# Patient Record
Sex: Male | Born: 1975
Health system: Southern US, Community
[De-identification: ages and names within clinical notes are randomized; demographics above are authoritative.]

## PROBLEM LIST (undated history)

## (undated) DIAGNOSIS — E785 Hyperlipidemia, unspecified: Secondary | ICD-10-CM

## (undated) DIAGNOSIS — F32A Depression, unspecified: Secondary | ICD-10-CM

## (undated) DIAGNOSIS — I10 Essential (primary) hypertension: Secondary | ICD-10-CM

## (undated) DIAGNOSIS — K219 Gastro-esophageal reflux disease without esophagitis: Secondary | ICD-10-CM

## (undated) DIAGNOSIS — F1010 Alcohol abuse, uncomplicated: Secondary | ICD-10-CM

## (undated) DIAGNOSIS — K859 Acute pancreatitis without necrosis or infection, unspecified: Secondary | ICD-10-CM

## (undated) DIAGNOSIS — N4340 Spermatocele of epididymis, unspecified: Secondary | ICD-10-CM

## (undated) DIAGNOSIS — D72829 Elevated white blood cell count, unspecified: Secondary | ICD-10-CM

## (undated) DIAGNOSIS — D649 Anemia, unspecified: Secondary | ICD-10-CM

## (undated) DIAGNOSIS — K760 Fatty (change of) liver, not elsewhere classified: Secondary | ICD-10-CM

## (undated) HISTORY — PX: COLONOSCOPY: SHX174

## (undated) HISTORY — PX: UPPER GI ENDOSCOPY: SHX6162

---

## 2017-12-28 ENCOUNTER — Inpatient Hospital Stay
Admission: EM | Admit: 2017-12-28 | Discharge: 2017-12-30 | DRG: 439 | Disposition: A | Discharge status: Home / Self Care | Payer: 59 | Attending: Internal Medicine | Admitting: Internal Medicine

## 2017-12-28 ENCOUNTER — Encounter: Payer: Self-pay | Admitting: Emergency Medicine

## 2017-12-28 ENCOUNTER — Other Ambulatory Visit: Payer: Self-pay

## 2017-12-28 DIAGNOSIS — R1010 Upper abdominal pain, unspecified: Secondary | ICD-10-CM | POA: Diagnosis not present

## 2017-12-28 DIAGNOSIS — K859 Acute pancreatitis without necrosis or infection, unspecified: Secondary | ICD-10-CM | POA: Diagnosis present

## 2017-12-28 DIAGNOSIS — R112 Nausea with vomiting, unspecified: Secondary | ICD-10-CM | POA: Diagnosis not present

## 2017-12-28 DIAGNOSIS — N179 Acute kidney failure, unspecified: Secondary | ICD-10-CM | POA: Diagnosis not present

## 2017-12-28 DIAGNOSIS — Z79899 Other long term (current) drug therapy: Secondary | ICD-10-CM

## 2017-12-28 DIAGNOSIS — Z882 Allergy status to sulfonamides status: Secondary | ICD-10-CM | POA: Diagnosis not present

## 2017-12-28 DIAGNOSIS — F419 Anxiety disorder, unspecified: Secondary | ICD-10-CM | POA: Diagnosis present

## 2017-12-28 DIAGNOSIS — K852 Alcohol induced acute pancreatitis without necrosis or infection: Secondary | ICD-10-CM | POA: Diagnosis not present

## 2017-12-28 DIAGNOSIS — F1010 Alcohol abuse, uncomplicated: Secondary | ICD-10-CM | POA: Diagnosis present

## 2017-12-28 DIAGNOSIS — R109 Unspecified abdominal pain: Secondary | ICD-10-CM | POA: Diagnosis not present

## 2017-12-28 HISTORY — DX: Acute pancreatitis without necrosis or infection, unspecified: K85.90

## 2017-12-28 LAB — COMPREHENSIVE METABOLIC PANEL
ALK PHOS: 36 U/L — AB (ref 38–126)
ALT: 20 U/L (ref 17–63)
AST: 32 U/L (ref 15–41)
Albumin: 4.7 g/dL (ref 3.5–5.0)
Anion gap: 11 (ref 5–15)
BUN: 25 mg/dL — ABNORMAL HIGH (ref 6–20)
CALCIUM: 9.6 mg/dL (ref 8.9–10.3)
CHLORIDE: 104 mmol/L (ref 101–111)
CO2: 23 mmol/L (ref 22–32)
CREATININE: 1.26 mg/dL — AB (ref 0.61–1.24)
Glucose, Bld: 184 mg/dL — ABNORMAL HIGH (ref 65–99)
Potassium: 3.7 mmol/L (ref 3.5–5.1)
SODIUM: 138 mmol/L (ref 135–145)
Total Bilirubin: 0.9 mg/dL (ref 0.3–1.2)
Total Protein: 7.4 g/dL (ref 6.5–8.1)

## 2017-12-28 LAB — LACTIC ACID, PLASMA
LACTIC ACID, VENOUS: 3.3 mmol/L — AB (ref 0.5–1.9)
Lactic Acid, Venous: 3.8 mmol/L (ref 0.5–1.9)

## 2017-12-28 LAB — LIPASE, BLOOD: LIPASE: 966 U/L — AB (ref 11–51)

## 2017-12-28 LAB — TROPONIN I

## 2017-12-28 LAB — LACTATE DEHYDROGENASE: LDH: 142 U/L (ref 98–192)

## 2017-12-28 LAB — CBC
HCT: 47 % (ref 40.0–52.0)
HEMOGLOBIN: 15.8 g/dL (ref 13.0–18.0)
MCH: 31 pg (ref 26.0–34.0)
MCHC: 33.6 g/dL (ref 32.0–36.0)
MCV: 92.4 fL (ref 80.0–100.0)
PLATELETS: 343 10*3/uL (ref 150–440)
RBC: 5.09 MIL/uL (ref 4.40–5.90)
RDW: 13.2 % (ref 11.5–14.5)
WBC: 15.2 10*3/uL — AB (ref 3.8–10.6)

## 2017-12-28 MED ORDER — SODIUM CHLORIDE 0.9 % IV BOLUS
1000.0000 mL | Freq: Once | INTRAVENOUS | Status: AC
  Administered 2017-12-28: 1000 mL via INTRAVENOUS

## 2017-12-28 MED ORDER — SODIUM CHLORIDE 0.9 % IV SOLN
INTRAVENOUS | Status: DC
  Administered 2017-12-28 – 2017-12-30 (×3): via INTRAVENOUS

## 2017-12-28 MED ORDER — HYDROMORPHONE HCL 1 MG/ML IJ SOLN
1.0000 mg | Freq: Once | INTRAMUSCULAR | Status: AC
  Administered 2017-12-28: 1 mg via INTRAVENOUS
  Filled 2017-12-28: qty 1

## 2017-12-28 MED ORDER — ONDANSETRON HCL 4 MG/2ML IJ SOLN
4.0000 mg | Freq: Four times a day (QID) | INTRAMUSCULAR | Status: DC | PRN
  Administered 2017-12-28: 4 mg via INTRAVENOUS
  Filled 2017-12-28: qty 2

## 2017-12-28 MED ORDER — ENOXAPARIN SODIUM 40 MG/0.4ML ~~LOC~~ SOLN
40.0000 mg | SUBCUTANEOUS | Status: DC
  Administered 2017-12-28 – 2017-12-29 (×2): 40 mg via SUBCUTANEOUS
  Filled 2017-12-28 (×2): qty 0.4

## 2017-12-28 MED ORDER — HYDROMORPHONE HCL 1 MG/ML IJ SOLN
1.0000 mg | INTRAMUSCULAR | Status: DC | PRN
Start: 2017-12-28 — End: 2017-12-30
  Administered 2017-12-28 – 2017-12-29 (×4): 1 mg via INTRAVENOUS
  Filled 2017-12-28 (×5): qty 1

## 2017-12-28 MED ORDER — ADULT MULTIVITAMIN W/MINERALS CH
1.0000 | ORAL_TABLET | Freq: Every day | ORAL | Status: DC
  Administered 2017-12-28 – 2017-12-30 (×3): 1 via ORAL
  Filled 2017-12-28 (×3): qty 1

## 2017-12-28 MED ORDER — PANTOPRAZOLE SODIUM 40 MG PO TBEC
40.0000 mg | DELAYED_RELEASE_TABLET | Freq: Every day | ORAL | Status: DC
  Administered 2017-12-28 – 2017-12-30 (×3): 40 mg via ORAL
  Filled 2017-12-28 (×3): qty 1

## 2017-12-28 MED ORDER — LORAZEPAM 2 MG PO TABS: 0.0000 mg | ORAL_TABLET | Freq: Two times a day (BID) | ORAL | Status: DC

## 2017-12-28 MED ORDER — LORAZEPAM 2 MG/ML IJ SOLN: 1.0000 mg | Freq: Four times a day (QID) | INTRAMUSCULAR | Status: DC | PRN

## 2017-12-28 MED ORDER — ONDANSETRON HCL 4 MG/2ML IJ SOLN
4.0000 mg | Freq: Once | INTRAMUSCULAR | Status: AC
  Administered 2017-12-28: 4 mg via INTRAVENOUS
  Filled 2017-12-28: qty 2

## 2017-12-28 MED ORDER — OXYCODONE HCL 5 MG PO TABS
5.0000 mg | ORAL_TABLET | ORAL | Status: DC | PRN
  Administered 2017-12-28 – 2017-12-29 (×3): 5 mg via ORAL
  Filled 2017-12-28 (×3): qty 1

## 2017-12-28 MED ORDER — THIAMINE HCL 100 MG/ML IJ SOLN
Freq: Once | INTRAVENOUS | Status: AC
  Administered 2017-12-28: 16:00:00 via INTRAVENOUS
  Filled 2017-12-28: qty 1000

## 2017-12-28 MED ORDER — THIAMINE HCL 100 MG/ML IJ SOLN
100.0000 mg | Freq: Every day | INTRAMUSCULAR | Status: DC
  Filled 2017-12-28: qty 1

## 2017-12-28 MED ORDER — LORAZEPAM 2 MG PO TABS: 0.0000 mg | ORAL_TABLET | Freq: Four times a day (QID) | ORAL | Status: DC

## 2017-12-28 MED ORDER — FOLIC ACID 1 MG PO TABS
1.0000 mg | ORAL_TABLET | Freq: Every day | ORAL | Status: DC
  Administered 2017-12-28 – 2017-12-30 (×3): 1 mg via ORAL
  Filled 2017-12-28 (×3): qty 1

## 2017-12-28 MED ORDER — LORAZEPAM 1 MG PO TABS: 1.0000 mg | ORAL_TABLET | Freq: Four times a day (QID) | ORAL | Status: DC | PRN

## 2017-12-28 MED ORDER — VITAMIN B-1 100 MG PO TABS
100.0000 mg | ORAL_TABLET | Freq: Every day | ORAL | Status: DC
  Administered 2017-12-28 – 2017-12-30 (×3): 100 mg via ORAL
  Filled 2017-12-28 (×3): qty 1

## 2017-12-28 MED ORDER — TRAMADOL HCL 50 MG PO TABS: 50.0000 mg | ORAL_TABLET | Freq: Four times a day (QID) | ORAL | Status: DC | PRN

## 2017-12-28 NOTE — ED Triage Notes (Signed)
Upper abd pain for 3 hours constant, but gets worse at times.  Skin cool and pale.  Says history of pancreatitis and this feels like that.

## 2017-12-28 NOTE — Progress Notes (Signed)
CRITICAL VALUE ALERT  Critical Value:  3.3 lactic   Date & Time Notied:  12/28/17 1439  Provider Notified: Gouru  Orders Received/Actions taken: No new orders

## 2017-12-28 NOTE — H&P (Addendum)
[facility] - [place] at [facility]   PATIENT NAME: Christopher Savage    MR#:  [id number]  DATE OF BIRTH:  Aug 07, 1976  DATE OF ADMISSION:  12/28/2017  PRIMARY CARE PHYSICIAN: Patient, No Pcp Per   REQUESTING/REFERRING PHYSICIAN: Dionne Bucy, MD  CHIEF COMPLAINT:  Acute abd pain  HISTORY OF PRESENT ILLNESS:  Christopher Savage  is a 42 y.o. male with a known history of alcohol abuse and history of pancreatitis from the alcohol in the past, recently moved from [place] and had extensive workup for pancreatitis and diagnosed with alcoholic pancreatitis in the past is presenting with acute abdominal pain in the epigastric and left upper quadrant area started from yesterday associated with nausea and vomiting.  Lipase is elevated in the ED.  His mother is at bedside Foley was in [place].  Reporting  PAST MEDICAL HISTORY:   Past Medical History:  Diagnosis Date  . Pancreatitis     PAST SURGICAL HISTOIRY:  History reviewed. No pertinent surgical history.  SOCIAL HISTORY:   Social History   Tobacco Use  . Smoking status: Never Smoker  . Smokeless tobacco: Never Used  Substance Use Topics  . Alcohol use: Yes    FAMILY HISTORY:  No family history on file.  DRUG ALLERGIES:   Allergies  Allergen Reactions  . Sulfa Antibiotics Hives    REVIEW OF SYSTEMS:  CONSTITUTIONAL: No fever, fatigue or weakness.  EYES: No blurred or double vision.  EARS, NOSE, AND THROAT: No tinnitus or ear pain.  RESPIRATORY: No cough, shortness of breath, wheezing or hemoptysis.  CARDIOVASCULAR: No chest pain, orthopnea, edema.  GASTROINTESTINAL: Reporting nausea, vomiting,  and epigastric abdominal pain.  GENITOURINARY: No dysuria, hematuria.  ENDOCRINE: No polyuria, nocturia,  HEMATOLOGY: No anemia, easy bruising or bleeding SKIN: No rash or lesion. MUSCULOSKELETAL: No joint pain or arthritis.   NEUROLOGIC: No tingling, numbness, weakness.  PSYCHIATRY: No  anxiety or depression.   MEDICATIONS AT HOME:   Prior to Admission medications   Medication Sig Start Date End Date Taking? Authorizing Provider  escitalopram (LEXAPRO) 10 MG tablet Take 10 mg by mouth daily.   Yes [provider]      VITAL SIGNS:  Blood pressure 135/83, pulse 68, temperature 97.7 F (36.5 C), temperature source Oral, resp. rate (!) 22, height 6\' 4"  (1.93 m), weight 103.7 kg (228 lb 9.9 oz), SpO2 100 %.  PHYSICAL EXAMINATION:  GENERAL:  42 y.o.-year-old patient lying in the bed with no acute distress.  EYES: Pupils equal, round, reactive to light and accommodation. No scleral icterus. Extraocular muscles intact.  HEENT: Head atraumatic, normocephalic. Oropharynx and nasopharynx clear.  NECK:  Supple, no jugular venous distention. No thyroid enlargement, no tenderness.  LUNGS: Normal breath sounds bilaterally, no wheezing, rales,rhonchi or crepitation. No use of accessory muscles of respiration.  CARDIOVASCULAR: S1, S2 normal. No murmurs, rubs, or gallops.  ABDOMEN: Soft,  has epigastric tenderness, nondistended. Bowel sounds present.EXTREMITIES: No pedal edema, cyanosis, or clubbing.  NEUROLOGIC: Cranial nerves II through XII are intact. Muscle strength 5/5 in all extremities. Sensation intact. Gait not checked.  PSYCHIATRIC: The patient is alert and oriented x 3.  SKIN: No obvious rash, lesion, or ulcer.   LABORATORY PANEL:   CBC Recent Labs  Lab 12/28/17 1130  WBC 15.2*  HGB 15.8  HCT 47.0  PLT 343   ------------------------------------------------------------------------------------------------------------------  Chemistries  Recent Labs  Lab 12/28/17 1130  NA 138  K 3.7  CL 104  CO2 23  GLUCOSE  184*  BUN 25*  CREATININE 1.26*  CALCIUM 9.6  AST 32  ALT 20  ALKPHOS 36*  BILITOT 0.9   ------------------------------------------------------------------------------------------------------------------  Cardiac Enzymes Recent Labs   Lab 12/28/17 1130  TROPONINI <0.03   ------------------------------------------------------------------------------------------------------------------  RADIOLOGY:  No results found.  EKG:   Orders placed or performed during the hospital encounter of 12/28/17  . ED EKG  . ED EKG  . EKG 12-Lead  . EKG 12-Lead  . EKG 12-Lead  . EKG 12-Lead  . EKG 12-Lead  . EKG 12-Lead  . EKG 12-Lead  . EKG 12-Lead    IMPRESSION AND PLAN:   #Acute epigastric abdominal pain from pancreatitis Admit to MedSurg unit N.p.o., IV fluids, pain management as needed Supportive treatment  #Acute pancreatitis alcoholic N.p.o., IV fluids and pain management Monitor lipase level in a.m. Counseled patient to stop drinking alcohol PPI  # AKI IVF , avoid nephrotoxins  #Alcohol abuse disorder Counseled patient to stop drinking alcohol and outpatient follow-up with alcohol anonymous CIWA Fluids with multivitamin, thiamine and folate  #Anxiety Benzos as needed    All the records are reviewed and case discussed with ED provider. Management plans discussed with the patient, family and they are in agreement.  CODE STATUS: fc   TOTAL TIME TAKING CARE OF THIS PATIENT: 43  minutes.   Note: This dictation was prepared with Dragon dictation along with smaller phrase technology. Any transcriptional errors that result from this process are unintentional.  Ramonita LabAruna Haiven Nardone M.D on 12/28/2017 at 2:31 PM  Between 7am to 6pm - Pager - [phone]  After 6pm go to [email or web address] - password [email or web address]  [facility]io [facility]  Office  [phone]  CC: Primary care physician; Patient, No Pcp Per

## 2017-12-28 NOTE — Progress Notes (Signed)
Anticoagulation monitoring(Lovenox):  42 yo male ordered Lovenox 30 mg Q24h  Filed Weights   12/28/17 1126 12/28/17 1411  Weight: 220 lb (99.8 kg) 228 lb 9.9 oz (103.7 kg)   BMI    Lab Results  Component Value Date   CREATININE 1.26 (H) 12/28/2017   Estimated Creatinine Clearance: 92.8 mL/min (A) (by C-G formula based on SCr of 1.26 mg/dL (H)). Hemoglobin & Hematocrit     Component Value Date/Time   HGB 15.8 12/28/2017 1130   HCT 47.0 12/28/2017 1130     Per Protocol for Patient with estCrcl > 30 ml/min and BMI < 40, will transition to Lovenox 40 mg Q24h.

## 2017-12-28 NOTE — ED Notes (Signed)
Date and time results received: 12/28/17 1226 (use smartphrase ".now" to insert current time)  Test: lactic Critical Value: 3.8  Name of Provider Notified: Siadecki  Orders Received? Or Actions Taken?: Orders Received - See Orders for details

## 2017-12-28 NOTE — ED Provider Notes (Signed)
[facility] Emergency Department Provider Note ____________________________________________   First MD Initiated Contact with Patient 12/28/17 1132     (approximate)  I have reviewed the triage vital signs and the nursing notes.   HISTORY  Chief Complaint Abdominal Pain    HPI Christopher Savage is a 42 y.o. male with history of pancreatitis (patient states alcohol related) who presents with upper abdominal pain for the last 3 hours, acute onset, constant but waxing and waning in intensity, nonradiating, and associated with nausea and vomiting.  No diarrhea or fever.  Patient states it feels similar to prior pancreatitis.  He states he has had pancreatitis twice before.  He reports that yesterday he drank 6 beers, and had spicy food during the weekend which she normally does not.  He denies daily alcohol use.   Past Medical History:  Diagnosis Date  . Pancreatitis     There are no active problems to display for this patient.   History reviewed. No pertinent surgical history.  Prior to Admission medications   Not on File    Allergies Sulfa antibiotics  No family history on file.  Social History Social History   Tobacco Use  . Smoking status: Never Smoker  . Smokeless tobacco: Never Used  Substance Use Topics  . Alcohol use: Yes  . Drug use: Not on file    Review of Systems  Constitutional: No fever.  Positive for chills. Eyes: No redness. ENT: No sore throat. Cardiovascular: Denies chest pain. Respiratory: Denies shortness of breath. Gastrointestinal: No positive for abdominal pain and vomiting.  Genitourinary: Negative for dysuria.  Musculoskeletal: Negative for back pain. Skin: Negative for rash. Neurological: Negative for headache.   ____________________________________________   PHYSICAL EXAM:  VITAL SIGNS: ED Triage Vitals [12/28/17 1126]  Enc Vitals Group     BP 111/72     Pulse Rate (!) 57     Resp 16     Temp     Temp src      SpO2 98 %     Weight 220 lb (99.8 kg)     Height 6\' 4"  (1.93 m)     Head Circumference      Peak Flow      Pain Score 10     Pain Loc      Pain Edu?      Excl. in GC?     Constitutional: Alert and oriented.  Uncomfortable but in no acute distress. Eyes: Conjunctivae are normal.  No scleral icterus. Head: Atraumatic. Nose: No congestion/rhinnorhea. Mouth/Throat: Mucous membranes are slightly dry.   Neck: Normal range of motion.  Cardiovascular: Normal rate, regular rhythm. Grossly normal heart sounds.  Good peripheral circulation. Respiratory: Normal respiratory effort.  No retractions. Lungs CTAB. Gastrointestinal: Soft with moderate epigastric tenderness. No distention.  Genitourinary: No flank tenderness. Musculoskeletal:   Extremities warm and well perfused.  Neurologic:  Normal speech and language. No gross focal neurologic deficits are appreciated.  Skin:  Skin is warm and dry. No rash noted. Psychiatric: Mood and affect are normal. Speech and behavior are normal.  ____________________________________________   LABS (all labs ordered are listed, but only abnormal results are displayed)  Labs Reviewed  LIPASE, BLOOD - Abnormal; Notable for the following components:      Result Value   Lipase 966 (*)    All other components within normal limits  COMPREHENSIVE METABOLIC PANEL - Abnormal; Notable for the following components:   Glucose, Bld 184 (*)    BUN  25 (*)    Creatinine, Ser 1.26 (*)    Alkaline Phosphatase 36 (*)    All other components within normal limits  CBC - Abnormal; Notable for the following components:   WBC 15.2 (*)    All other components within normal limits  LACTIC ACID, PLASMA - Abnormal; Notable for the following components:   Lactic Acid, Venous 3.8 (*)    All other components within normal limits  LACTATE DEHYDROGENASE  TROPONIN I  LACTIC ACID, PLASMA   ____________________________________________  EKG  ED ECG REPORT I,  Dionne Bucy, the attending physician, personally viewed and interpreted this ECG.  Date: 12/28/2017 EKG Time: 1124 Rate: 55 Rhythm: normal sinus rhythm QRS Axis: normal Intervals: normal ST/T Wave abnormalities: <75mm ST elevation inferior consistent with early repolarization Narrative Interpretation: no evidence of acute ischemia; no prior EKG available for comparison  ____________________________________________  RADIOLOGY    ____________________________________________   PROCEDURES  Procedure(s) performed: No  Procedures  Critical Care performed: No ____________________________________________   INITIAL IMPRESSION / ASSESSMENT AND PLAN / ED COURSE  Pertinent labs & imaging results that were available during my care of the patient were reviewed by me and considered in my medical decision making (see chart for details).  42 year old male with past history of pancreatitis induced by alcohol presents with epigastric pain, nausea, and vomiting which she states is identical to prior pancreatitis and occurred after he drank relatively heavily during the weekend.  I attempted to review past medical records in epic and the [place] PMP Registry, however patient states he recently moved from [place] and has no prior records here.  On exam, the patient is uncomfortable but not acutely ill-appearing, he has epigastric tenderness, and the remainder the exam is as described above.  Presentation is concerning for recurrent pancreatitis versus gastritis or PUD.  Less likely other hepatobiliary cause.  Will obtain labs, give IV fluids, and reassess.  If no significant lab abnormalities, consider imaging.  Of note, the patient repeatedly requested pain medication from RNs prior to my evaluation, and then specifically asked me for Dilaudid.  At this time patient's evaluation is consistent with pancreatitis so we will treat with opiate analgesics as normally indicated.  I have no prior  records to determine if patient has any prior substance abuse history, but he denies this.  We will reassess if workup is negative.    ----------------------------------------- 12:44 PM on 12/28/2017 -----------------------------------------  Lactate is elevated.  This is consistent with likely acute pancreatitis and vomiting.  Patient's vital signs are stable and there is no clinical evidence of sepsis.  ----------------------------------------- 1:03 PM on 12/28/2017 -----------------------------------------  Labs reveal significantly elevated lipase, but normal LFTs and otherwise reassuring.  Due to the elevated lipase and lactic acid, patient is appropriate for admission for IV hydration and pain control.  I will sign the patient out to the hospitalist.  ____________________________________________   FINAL CLINICAL IMPRESSION(S) / ED DIAGNOSES  Final diagnoses:  Alcohol-induced acute pancreatitis, unspecified complication status      NEW MEDICATIONS STARTED DURING THIS VISIT:  New Prescriptions   No medications on file     Note:  This document was prepared using Dragon voice recognition software and may include unintentional dictation errors.    Dionne Bucy, MD 12/28/17 1304

## 2017-12-29 LAB — LIPASE, BLOOD: Lipase: 1070 U/L — ABNORMAL HIGH (ref 11–51)

## 2017-12-29 LAB — COMPREHENSIVE METABOLIC PANEL
ALBUMIN: 4 g/dL (ref 3.5–5.0)
ALK PHOS: 29 U/L — AB (ref 38–126)
ALT: 17 U/L (ref 17–63)
AST: 24 U/L (ref 15–41)
Anion gap: 8 (ref 5–15)
BILIRUBIN TOTAL: 1 mg/dL (ref 0.3–1.2)
BUN: 18 mg/dL (ref 6–20)
CALCIUM: 8.8 mg/dL — AB (ref 8.9–10.3)
CO2: 26 mmol/L (ref 22–32)
CREATININE: 1.15 mg/dL (ref 0.61–1.24)
Chloride: 104 mmol/L (ref 101–111)
GFR calc Af Amer: >60 mL/min (ref >60)
GLUCOSE: 131 mg/dL — AB (ref 65–99)
POTASSIUM: 4 mmol/L (ref 3.5–5.1)
Sodium: 138 mmol/L (ref 135–145)
TOTAL PROTEIN: 6.8 g/dL (ref 6.5–8.1)

## 2017-12-29 LAB — CBC
HEMATOCRIT: 45.2 % (ref 40.0–52.0)
Hemoglobin: 15.2 g/dL (ref 13.0–18.0)
MCH: 31.2 pg (ref 26.0–34.0)
MCHC: 33.6 g/dL (ref 32.0–36.0)
MCV: 92.8 fL (ref 80.0–100.0)
Platelets: 258 10*3/uL (ref 150–440)
RBC: 4.87 MIL/uL (ref 4.40–5.90)
RDW: 13.5 % (ref 11.5–14.5)
WBC: 9.7 10*3/uL (ref 3.8–10.6)

## 2017-12-29 MED ORDER — ESCITALOPRAM OXALATE 10 MG PO TABS
10.0000 mg | ORAL_TABLET | Freq: Every day | ORAL | Status: DC
  Administered 2017-12-29 – 2017-12-30 (×2): 10 mg via ORAL
  Filled 2017-12-29 (×2): qty 1

## 2017-12-29 MED ORDER — LACTULOSE 10 GM/15ML PO SOLN
30.0000 g | Freq: Every day | ORAL | Status: DC | PRN
  Administered 2017-12-29: 30 g via ORAL
  Filled 2017-12-29: qty 60

## 2017-12-29 NOTE — Progress Notes (Signed)
[facility] - [place] at [facility]   PATIENT NAME: Christopher Savage    MR#:  [id number]  DATE OF BIRTH:  July 01, 1976  SUBJECTIVE:   Patient came in with abdominal pain epigastric area. Feels somewhat better today.  Admits to drinking alcohol on a daily basis. No tremors agitation or tachycardia today REVIEW OF SYSTEMS:   Review of Systems  Constitutional: Negative for chills, fever and weight loss.  HENT: Negative for ear discharge, ear pain and nosebleeds.   Eyes: Negative for blurred vision, pain and discharge.  Respiratory: Negative for sputum production, shortness of breath, wheezing and stridor.   Cardiovascular: Negative for chest pain, palpitations, orthopnea and PND.  Gastrointestinal: Positive for abdominal pain. Negative for diarrhea, nausea and vomiting.  Genitourinary: Negative for frequency and urgency.  Musculoskeletal: Negative for back pain and joint pain.  Neurological: Negative for sensory change, speech change, focal weakness and weakness.  Psychiatric/Behavioral: Negative for depression and hallucinations. The patient is not nervous/anxious.    Tolerating Diet:yes Tolerating PT: not needed  DRUG ALLERGIES:   Allergies  Allergen Reactions  . Sulfa Antibiotics Hives    VITALS:  Blood pressure (!) 135/94, pulse 89, temperature 99 F (37.2 C), temperature source Oral, resp. rate 18, height 6\' 4"  (1.93 m), weight 103.7 kg (228 lb 9.9 oz), SpO2 98 %.  PHYSICAL EXAMINATION:   Physical Exam  GENERAL:  42 y.o.-year-old patient lying in the bed with no acute distress.  EYES: Pupils equal, round, reactive to light and accommodation. No scleral icterus. Extraocular muscles intact.  HEENT: Head atraumatic, normocephalic. Oropharynx and nasopharynx clear.  NECK:  Supple, no jugular venous distention. No thyroid enlargement, no tenderness.  LUNGS: Normal breath sounds bilaterally, no wheezing, rales, rhonchi. No use of accessory muscles of  respiration.  CARDIOVASCULAR: S1, S2 normal. No murmurs, rubs, or gallops.  ABDOMEN: Soft, nontender, nondistended. Bowel sounds present. No organomegaly or mass.  EXTREMITIES: No cyanosis, clubbing or edema b/l.    NEUROLOGIC: Cranial nerves II through XII are intact. No focal Motor or sensory deficits b/l.   PSYCHIATRIC:  patient is alert and oriented x 3.  SKIN: No obvious rash, lesion, or ulcer.   LABORATORY PANEL:  CBC Recent Labs  Lab 12/29/17 0705  WBC 9.7  HGB 15.2  HCT 45.2  PLT 258    Chemistries  Recent Labs  Lab 12/29/17 0705  NA 138  K 4.0  CL 104  CO2 26  GLUCOSE 131*  BUN 18  CREATININE 1.15  CALCIUM 8.8*  AST 24  ALT 17  ALKPHOS 29*  BILITOT 1.0   Cardiac Enzymes Recent Labs  Lab 12/28/17 1130  TROPONINI <0.03   RADIOLOGY:  No results found. ASSESSMENT AND PLAN:  Christopher Savage  is a 42 y.o. male with a known history of alcohol abuse and history of pancreatitis from the alcohol in the past, recently moved from [place] and had extensive workup for pancreatitis and diagnosed with alcoholic pancreatitis in the past is presenting with acute abdominal pain in the epigastric and left upper quadrant area started from yesterday associated with nausea and vomiting.  # Acute epigastric abdominal pain from alcohol induced pancreatitis N.p.o., IV fluids, pain management as needed Supportive treatment  -Lipase 966--- 1022--appears in a -CIWA  protocol.  Patient admits he will stop drinking alcohol  # AKI IVF  - avoid nephrotoxins  #Alcohol abuse disorder Counseled patient to stop drinking alcohol and outpatient follow-up with alcohol anonymous CIWA Fluids with multivitamin, thiamine and  folate  #Anxiety Benzos as needed -Patient is on low-dose Lexapro.  He follows up with psychiatrist as outpatient  Case discussed with Care Management/Social Worker. Management plans discussed with the patient, family and they are in agreement.  CODE  STATUS: FULL  DVT Prophylaxis: lovneox  TOTAL TIME TAKING CARE OF THIS PATIENT: *30minutes.  >50% time spent on counselling and coordination of care  POSSIBLE D/C IN 1-2 DAYS, DEPENDING ON CLINICAL CONDITION.  Note: This dictation was prepared with Dragon dictation along with smaller phrase technology. Any transcriptional errors that result from this process are unintentional.  Enedina FinnerSona Jamill Wetmore M.D on 12/29/2017 at 1:23 PM  Between 7am to 6pm - Pager - [phone]  After 6pm go to [email or web address] - password [email or web address]  [facility]  Office  [phone]  CC: Primary care physician; Patient, No Pcp PerPatient ID: Christopher KetScott Savage, male   DOB: [birth year], 42 y.o.   MRN: [id number]

## 2017-12-29 NOTE — Progress Notes (Signed)
Per MD pt may have water as tolerated.

## 2017-12-29 NOTE — Progress Notes (Signed)
Inpatient Diabetes Program Recommendations  AACE/ADA: New Consensus Statement on Inpatient Glycemic Control (2015)  Target Ranges:  Prepandial:   less than 140 mg/dL      Peak postprandial:   less than 180 mg/dL (1-2 hours)      Critically ill patients:  140 - 180 mg/dL   Results for Toniann KetCLAYPOOL, Celedonio (MRN [id number]) as of 12/29/2017 10:35  Ref. Range 12/28/2017 11:30 12/29/2017 07:05  Glucose Latest Ref Range: 65 - 99 mg/dL 782184 (H) 956131 (H)   Review of Glycemic Control  Diabetes history: No Outpatient Diabetes medications: NA Current orders for Inpatient glycemic control: None  Inpatient Diabetes Program Recommendations:  Correction (SSI): Please consider ordering CBGs with Novolog 0-9 units Q4H. HgbA1C: Please consider ordering an A1C to evaluate glycemic control over the past 2-3 months.  NOTE: Admitted with acute pancreatitis and initial glucose 184 mg/dl on 2/1/304/1/19 at 86:5711:30.   Thanks, Orlando PennerMarie Willford Rabideau, RN, MSN, CDE Diabetes Coordinator Inpatient Diabetes Program [phone] (Team Pager from 8am to 5pm)

## 2017-12-30 LAB — LIPASE, BLOOD: Lipase: 873 U/L — ABNORMAL HIGH (ref 11–51)

## 2017-12-30 LAB — HIV ANTIBODY (ROUTINE TESTING W REFLEX): HIV Screen 4th Generation wRfx: NONREACTIVE

## 2017-12-30 MED ORDER — ADULT MULTIVITAMIN W/MINERALS CH: 1.0000 | ORAL_TABLET | Freq: Every day | ORAL | 2 refills | Status: AC

## 2017-12-30 MED ORDER — OXYCODONE HCL 5 MG PO TABS: 5.0000 mg | ORAL_TABLET | ORAL | 0 refills | Status: DC | PRN

## 2017-12-30 NOTE — Discharge Instructions (Signed)
Patient is recommended to abstain from alcohol. Follow-up with your psychiatrist as before Clear to full liquid diet for 2-3 days Return to emergency room if signs symptoms worsen

## 2017-12-30 NOTE — Discharge Summary (Signed)
[facility] [place] [facility]   PATIENT NAME: Christopher Savage    MR#:  [id number]  DATE OF BIRTH:  16-Feb-1976  DATE OF ADMISSION:  12/28/2017 ADMITTING PHYSICIAN: Ramonita LabAruna Gouru, MD  DATE OF DISCHARGE: 12/30/2017  PRIMARY CARE PHYSICIAN: Patient, No Pcp Per    ADMISSION DIAGNOSIS:  Alcohol-induced acute pancreatitis, unspecified complication status [K85.20]  DISCHARGE DIAGNOSIS:  Acute alcohol induced pancreatitis  SECONDARY DIAGNOSIS:   Past Medical History:  Diagnosis Date  . Pancreatitis     HOSPITAL COURSE:  [name], [name]  # Acute epigastric abdominal pain from alcohol induced pancreatitis N.p.o., IV fluids, pain management as needed Supportive treatment  -Lipase 966--- 1022-- 800 -CIWA  protocol.  Patient admits he will stop drinking alcohol -Patient's abdominal pain is much improved.  He is rating anywhere from 3-4.  He is tolerating clear liquid diet.  # AKI -Received IVF  - avoid nephrotoxins  #Alcohol abuse disorder Counseled patient to stop drinking alcohol and outpatient follow-up with alcohol anonymous -Patient was placed on CIWA  #Anxiety chronic -Patient is on low-dose Lexapro.  He follows up with psychiatrist as outpatient  Overall improved.  Patient is agreeable to go home.  Is recommended to follow-up in the ER if signs symptoms worsen.  He is also recommended to clear/full liquid diet for the next few days.  CONSULTS OBTAINED:    DRUG ALLERGIES:   Allergies  Allergen Reactions  . Sulfa Antibiotics Hives    DISCHARGE MEDICATIONS:    Allergies as of 12/30/2017      Reactions   Sulfa Antibiotics Hives      Medication List    TAKE these medications   escitalopram 10 MG tablet Commonly known as:  LEXAPRO Take 10 mg by mouth daily.   multivitamin with minerals Tabs tablet Take 1 tablet by mouth daily. Start taking on:  12/31/2017   oxyCODONE 5 MG immediate release tablet Commonly known as:  Oxy IR/ROXICODONE Take 1 tablet (5 mg total) by mouth every 4 (four) hours as needed for moderate pain or breakthrough pain.       If you experience worsening of your admission symptoms, develop shortness of breath, life threatening emergency, suicidal or homicidal thoughts you must seek medical attention immediately by calling 911 or calling your MD immediately  if symptoms less severe.  You Must read complete instructions/literature along with all the possible adverse reactions/side effects for all the Medicines you take and that have been prescribed to you. Take any new Medicines after you have completely understood and accept all the possible adverse reactions/side effects.   Please note  You were cared for by a hospitalist during your hospital stay. If you have any questions about your discharge medications or the care you received while you were in the hospital after you are discharged, you can call the unit and asked to speak with the hospitalist on call if the hospitalist that took care of you is not available. Once you are discharged, your primary care physician will handle any further medical issues. Please note that NO REFILLS for any discharge medications will be authorized once you are discharged, as it is imperative  that you return to your primary care physician (or establish a relationship with a primary care physician if you do not have one) for your aftercare needs so that they can reassess your need for medications and monitor your lab values. Today   SUBJECTIVE   Doing well  VITAL SIGNS:  Blood pressure (!)  141/90, pulse 85, temperature 99.8 F (37.7 C), temperature source Oral, resp. rate 16, height 6\' 4"  (1.93 m), weight 103.7 kg (228 lb 9.9 oz), SpO2 96 %.  I/O:  No intake or output data in the 24 hours ending 12/30/17 1320  PHYSICAL EXAMINATION:  GENERAL:  42 y.o.-year-old patient lying in the bed with no acute distress.  EYES: Pupils equal, round, reactive to light and accommodation. No scleral icterus. Extraocular muscles intact.  HEENT: Head atraumatic, normocephalic. Oropharynx and nasopharynx clear.  NECK:  Supple, no jugular venous distention. No thyroid enlargement, no tenderness.  LUNGS: Normal breath sounds bilaterally, no wheezing, rales,rhonchi or crepitation. No use of accessory muscles of respiration.  CARDIOVASCULAR: S1, S2 normal. No murmurs, rubs, or gallops.  ABDOMEN: Soft, non-tender, non-distended. Bowel sounds present. No organomegaly or mass.  EXTREMITIES: No pedal edema, cyanosis, or clubbing.  NEUROLOGIC: Cranial nerves II through XII are intact. Muscle strength 5/5 in all extremities. Sensation intact. Gait not checked.  PSYCHIATRIC: The patient is alert and oriented x 3.  SKIN: No obvious rash, lesion, or ulcer.   DATA REVIEW:   CBC  Recent Labs  Lab 12/29/17 0705  WBC 9.7  HGB 15.2  HCT 45.2  PLT 258    Chemistries  Recent Labs  Lab 12/29/17 0705  NA 138  K 4.0  CL 104  CO2 26  GLUCOSE 131*  BUN 18  CREATININE 1.15  CALCIUM 8.8*  AST 24  ALT 17  ALKPHOS 29*  BILITOT 1.0    Microbiology Results   No results found for this or any previous visit (from the past 240 hour(s)).  RADIOLOGY:  No results found.   Management plans discussed with the patient, family and they are in agreement.  CODE STATUS:     Code Status Orders  (From admission, onward)        Start     Ordered   12/28/17 1456  Full code  Continuous     12/28/17 1455    Code Status History    This patient has a current code status but no historical code status.       TOTAL TIME TAKING CARE OF THIS PATIENT: *40* minutes.    Enedina Finner M.D on 12/30/2017 at 1:20 PM  Between 7am to 6pm - Pager - [phone] After 6pm go to [email or web address] - password [employer]  [facility]  Office  [phone]  CC: Primary care physician; Patient, No Pcp Per

## 2017-12-30 NOTE — Progress Notes (Signed)
Dr[facility] Burn[facility] is [facility]'s primary care MD. Patient establishing care with this practice at discharge per patient.

## 2017-12-30 NOTE — Progress Notes (Signed)
Discharged instructions and prescriptions reviewed with patient. Patient denied questions. IV removed without complication. Patient awaiting w/c for discharge.

## 2018-01-11 DIAGNOSIS — F32A Depression, unspecified: Secondary | ICD-10-CM | POA: Insufficient documentation

## 2018-01-11 DIAGNOSIS — Z5181 Encounter for therapeutic drug level monitoring: Secondary | ICD-10-CM | POA: Diagnosis not present

## 2018-01-11 DIAGNOSIS — F1010 Alcohol abuse, uncomplicated: Secondary | ICD-10-CM | POA: Insufficient documentation

## 2018-01-11 DIAGNOSIS — F329 Major depressive disorder, single episode, unspecified: Secondary | ICD-10-CM | POA: Insufficient documentation

## 2018-01-11 DIAGNOSIS — Z8719 Personal history of other diseases of the digestive system: Secondary | ICD-10-CM | POA: Insufficient documentation

## 2018-02-05 ENCOUNTER — Ambulatory Visit: Payer: Self-pay | Admitting: Psychiatry

## 2018-03-08 ENCOUNTER — Encounter: Payer: Self-pay | Admitting: Psychiatry

## 2018-03-08 ENCOUNTER — Other Ambulatory Visit: Payer: Self-pay

## 2018-03-08 ENCOUNTER — Ambulatory Visit (INDEPENDENT_AMBULATORY_CARE_PROVIDER_SITE_OTHER): Payer: 59 | Admitting: Psychiatry

## 2018-03-08 VITALS — BP 121/84 | HR 67 | Temp 97.5°F | Wt 222.0 lb

## 2018-03-08 DIAGNOSIS — F341 Dysthymic disorder: Secondary | ICD-10-CM

## 2018-03-08 DIAGNOSIS — F5105 Insomnia due to other mental disorder: Secondary | ICD-10-CM | POA: Diagnosis not present

## 2018-03-08 DIAGNOSIS — F1021 Alcohol dependence, in remission: Secondary | ICD-10-CM

## 2018-03-08 MED ORDER — TRAZODONE HCL 50 MG PO TABS: 50.0000 mg | ORAL_TABLET | Freq: Every evening | ORAL | 2 refills | Status: DC | PRN

## 2018-03-08 NOTE — Patient Instructions (Signed)
Trazodone tablets  What is this medicine?  TRAZODONE (TRAZ oh done) is used to treat depression.  This medicine may be used for other purposes; ask your health care provider or pharmacist if you have questions.  COMMON BRAND NAME(S): Desyrel  What should I tell my health care provider before I take this medicine?  They need to know if you have any of these conditions:  -attempted suicide or thinking about it  -bipolar disorder  -bleeding problems  -glaucoma  -heart disease, or previous heart attack  -irregular heart beat  -kidney or liver disease  -low levels of sodium in the blood  -an unusual or allergic reaction to trazodone, other medicines, foods, dyes or preservatives  -pregnant or trying to get pregnant  -breast-feeding  How should I use this medicine?  Take this medicine by mouth with a glass of water. Follow the directions on the prescription label. Take this medicine shortly after a meal or a light snack. Take your medicine at regular intervals. Do not take your medicine more often than directed. Do not stop taking this medicine suddenly except upon the advice of your doctor. Stopping this medicine too quickly may cause serious side effects or your condition may worsen.  A special MedGuide will be given to you by the pharmacist with each prescription and refill. Be sure to read this information carefully each time.  Talk to your pediatrician regarding the use of this medicine in children. Special care may be needed.  Overdosage: If you think you have taken too much of this medicine contact a poison control center or emergency room at once.  NOTE: This medicine is only for you. Do not share this medicine with others.  What if I miss a dose?  If you miss a dose, take it as soon as you can. If it is almost time for your next dose, take only that dose. Do not take double or extra doses.  What may interact with this medicine?  Do not take this medicine with any of the following medications:  -certain medicines  for fungal infections like fluconazole, itraconazole, ketoconazole, posaconazole, voriconazole  -cisapride  -dofetilide  -dronedarone  -linezolid  -MAOIs like Carbex, Eldepryl, Marplan, Nardil, and Parnate  -mesoridazine  -methylene blue (injected into a vein)  -pimozide  -saquinavir  -thioridazine  -ziprasidone  This medicine may also interact with the following medications:  -alcohol  -antiviral medicines for HIV or AIDS  -aspirin and aspirin-like medicines  -barbiturates like phenobarbital  -certain medicines for blood pressure, heart disease, irregular heart beat  -certain medicines for depression, anxiety, or psychotic disturbances  -certain medicines for migraine headache like almotriptan, eletriptan, frovatriptan, naratriptan, rizatriptan, sumatriptan, zolmitriptan  -certain medicines for seizures like carbamazepine and phenytoin  -certain medicines for sleep  -certain medicines that treat or prevent blood clots like dalteparin, enoxaparin, warfarin  -digoxin  -fentanyl  -lithium  -NSAIDS, medicines for pain and inflammation, like ibuprofen or naproxen  -other medicines that prolong the QT interval (cause an abnormal heart rhythm)  -rasagiline  -supplements like St. John's wort, kava kava, valerian  -tramadol  -tryptophan  This list may not describe all possible interactions. Give your health care provider a list of all the medicines, herbs, non-prescription drugs, or dietary supplements you use. Also tell them if you smoke, drink alcohol, or use illegal drugs. Some items may interact with your medicine.  What should I watch for while using this medicine?  Tell your doctor if your symptoms do not get better   or if they get worse. Visit your doctor or health care professional for regular checks on your progress. Because it may take several weeks to see the full effects of this medicine, it is important to continue your treatment as prescribed by your doctor.  Patients and their families should watch out for new  or worsening thoughts of suicide or depression. Also watch out for sudden changes in feelings such as feeling anxious, agitated, panicky, irritable, hostile, aggressive, impulsive, severely restless, overly excited and hyperactive, or not being able to sleep. If this happens, especially at the beginning of treatment or after a change in dose, call your health care professional.  You may get drowsy or dizzy. Do not drive, use machinery, or do anything that needs mental alertness until you know how this medicine affects you. Do not stand or sit up quickly, especially if you are an older patient. This reduces the risk of dizzy or fainting spells. Alcohol may interfere with the effect of this medicine. Avoid alcoholic drinks.  This medicine may cause dry eyes and blurred vision. If you wear contact lenses you may feel some discomfort. Lubricating drops may help. See your eye doctor if the problem does not go away or is severe.  Your mouth may get dry. Chewing sugarless gum, sucking hard candy and drinking plenty of water may help. Contact your doctor if the problem does not go away or is severe.  What side effects may I notice from receiving this medicine?  Side effects that you should report to your doctor or health care professional as soon as possible:  -allergic reactions like skin rash, itching or hives, swelling of the face, lips, or tongue  -elevated mood, decreased need for sleep, racing thoughts, impulsive behavior  -confusion  -fast, irregular heartbeat  -feeling faint or lightheaded, falls  -feeling agitated, angry, or irritable  -loss of balance or coordination  -painful or prolonged erections  -restlessness, pacing, inability to keep still  -suicidal thoughts or other mood changes  -tremors  -trouble sleeping  -seizures  -unusual bleeding or bruising  Side effects that usually do not require medical attention (report to your doctor or health care professional if they continue or are bothersome):  -change in  sex drive or performance  -change in appetite or weight  -constipation  -headache  -muscle aches or pains  -nausea  This list may not describe all possible side effects. Call your doctor for medical advice about side effects. You may report side effects to FDA at 1-800-FDA-1088.  Where should I keep my medicine?  Keep out of the reach of children.  Store at room temperature between 15 and 30 degrees C (59 to 86 degrees F). Protect from light. Keep container tightly closed. Throw away any unused medicine after the expiration date.  NOTE: This sheet is a summary. It may not cover all possible information. If you have questions about this medicine, talk to your doctor, pharmacist, or health care provider.   2018 Elsevier/Gold Standard (2016-02-14 16:57:05)     Insomnia  Insomnia is a sleep disorder that makes it difficult to fall asleep or to stay asleep. Insomnia can cause tiredness (fatigue), low energy, difficulty concentrating, mood swings, and poor performance at work or school.  There are three different ways to classify insomnia:   Difficulty falling asleep.   Difficulty staying asleep.   Waking up too early in the morning.    Any type of insomnia can be long-term (chronic) or short-term (acute). Both are   common. Short-term insomnia usually lasts for three months or less. Chronic insomnia occurs at least three times a week for longer than three months.  What are the causes?  Insomnia may be caused by another condition, situation, or substance, such as:   Anxiety.   Certain medicines.   Gastroesophageal reflux disease (GERD) or other gastrointestinal conditions.   Asthma or other breathing conditions.   Restless legs syndrome, sleep apnea, or other sleep disorders.   Chronic pain.   Menopause. This may include hot flashes.   Stroke.   Abuse of alcohol, tobacco, or illegal drugs.   Depression.   Caffeine.   Neurological disorders, such as Alzheimer disease.   An overactive thyroid  (hyperthyroidism).    The cause of insomnia may not be known.  What increases the risk?  Risk factors for insomnia include:   Gender. Women are more commonly affected than men.   Age. Insomnia is more common as you get older.   Stress. This may involve your professional or personal life.   Income. Insomnia is more common in people with lower income.   Lack of exercise.   Irregular work schedule or night shifts.   Traveling between different time zones.    What are the signs or symptoms?  If you have insomnia, trouble falling asleep or trouble staying asleep is the main symptom. This may lead to other symptoms, such as:   Feeling fatigued.   Feeling nervous about going to sleep.   Not feeling rested in the morning.   Having trouble concentrating.   Feeling irritable, anxious, or depressed.    How is this treated?  Treatment for insomnia depends on the cause. If your insomnia is caused by an underlying condition, treatment will focus on addressing the condition. Treatment may also include:   Medicines to help you sleep.   Counseling or therapy.   Lifestyle adjustments.    Follow these instructions at home:   Take medicines only as directed by your health care provider.   Keep regular sleeping and waking hours. Avoid naps.   Keep a sleep diary to help you and your health care provider figure out what could be causing your insomnia. Include:  ? When you sleep.  ? When you wake up during the night.  ? How well you sleep.  ? How rested you feel the next day.  ? Any side effects of medicines you are taking.  ? What you eat and drink.   Make your bedroom a comfortable place where it is easy to fall asleep:  ? Put up shades or special blackout curtains to block light from outside.  ? Use a white noise machine to block noise.  ? Keep the temperature cool.   Exercise regularly as directed by your health care provider. Avoid exercising right before bedtime.   Use relaxation techniques to manage stress. Ask  your health care provider to suggest some techniques that may work well for you. These may include:  ? Breathing exercises.  ? Routines to release muscle tension.  ? Visualizing peaceful scenes.   Cut back on alcohol, caffeinated beverages, and cigarettes, especially close to bedtime. These can disrupt your sleep.   Do not overeat or eat spicy foods right before bedtime. This can lead to digestive discomfort that can make it hard for you to sleep.   Limit screen use before bedtime. This includes:  ? Watching TV.  ? Using your smartphone, tablet, and computer.   Stick to a routine.   This can help you fall asleep faster. Try to do a quiet activity, brush your teeth, and go to bed at the same time each night.   Get out of bed if you are still awake after 15 minutes of trying to sleep. Keep the lights down, but try reading or doing a quiet activity. When you feel sleepy, go back to bed.   Make sure that you drive carefully. Avoid driving if you feel very sleepy.   Keep all follow-up appointments as directed by your health care provider. This is important.  Contact a health care provider if:   You are tired throughout the day or have trouble in your daily routine due to sleepiness.   You continue to have sleep problems or your sleep problems get worse.  Get help right away if:   You have serious thoughts about hurting yourself or someone else.  This information is not intended to replace advice given to you by your health care provider. Make sure you discuss any questions you have with your health care provider.  Document Released: 09/12/2000 Document Revised: 02/15/2016 Document Reviewed: 06/16/2014  Elsevier Interactive Patient Education  2018 [employer]

## 2018-03-08 NOTE — Progress Notes (Signed)
Psychiatric Initial Adult Assessment   Patient Identification: Christopher Savage MRN:  [id number] Date of Evaluation:  03/08/2018 Referral Source: Dr.Johnston Chief Complaint:  ' I am here to establish care." Chief Complaint    Establish Care; Anxiety; Depression     Visit Diagnosis:    ICD-10-CM   1. Persistent depressive disorder with anxious distress, currently moderate F34.1   2. Alcohol use disorder, moderate, in early remission (HCC) F10.21   3. Insomnia due to mental condition F51.05     History of Present Illness:  Christopher Savage is a 42 year old Caucasian male, single, employed, lives at [place], has a history of depression, anxiety, alcohol use disorder in early remission, sleep problems, presented to the clinic today to establish care.  Patient reports that he relocated to [place] from [place] in September 2018.  Patient reports he was under the care of a psychiatrist in [place].  He reports he was being treated for depression and anxiety, diagnosed when he was very young.  He reports that his primary medical doctor prescribed his medications here after the relocation.  He reports most recently he has noticed some low-grade depressive symptoms as well as anxiety symptoms which persist all the time.  He hence decided to find a psychiatrist and also a therapist to get back into treatment.  Patient describes his depressive symptoms as low-grade sadness, sleep problems, low energy, trouble concentrating and so on.  He however reports it is very mild and is there all the time.  He reports he feels he has to struggle to get through the day however he is able to function at work.  He denies any suicidality.  He reports he is on Lexapro 30 mg which is beneficial.  He however wonders whether any medication changes needs to be done to help him better.  He is also interested in psychotherapy.  He reports 3-4 times a week he takes ZzzQuil for sleep.  Patient reports he has trouble falling  asleep and it helps.  He already follows a good sleep hygiene.  He reports anxiety symptoms.  He reports there is a lot going on at this time.  He reports the relocation as well as building a  in a new home has been making him more anxious.  He however reports he relocated to [place] to be closer to his family.  His parents and his siblings live in [place].  He reports they are very supportive.  Patient reports a history of alcoholism.  He reports he started drinking at the age of 42.  He reports he drank heavily for several years.  He quit drinking in 2010 and was sober for 6 months and then in 2012 and was sober for 4 years and then he relapsed again.  He reports his last drink was 90 days ago.  He currently attends AA meetings 3 times a week.  He currently denies any craving.  He reports the AA meetings is very beneficial.  He struggles with pancreatitis.  He reports that is another reason he wants to stop drinking and also get the right help for his anxiety and depressive symptoms.  Patient denies any history of trauma.  Patient denies any history of perceptual disturbances.  Patient denies any history of bipolar disorder.    Associated Signs/Symptoms: Depression Symptoms:  depressed mood, insomnia, anxiety, disturbed sleep, (Hypo) Manic Symptoms:  denies Anxiety Symptoms:  low grade worry Psychotic Symptoms:  denies PTSD Symptoms: Negative  Past Psychiatric History: Reports a history of  depression and anxiety, who was under the care of a psychiatrist and therapist in [place].  His medications are currently being treated by his PMD.  Pt also followed up with [facility] for a very short.[facility]  He denies any suicide attempts.  He denies any inpatient mental health admissions.  Previous Psychotropic Medications: Yes -Lexapro, Ambien.  Substance Abuse History in the last 12 months:  Yes.  -Alcohol abuse.  His last drink was 90 days ago.  He has a history of pancreatitis due to his  drinking.  He currently attends AA meeting.  He started drinking at the age of 82.  And as noted above he had tried to quit several times in the past but always relapsed.  Consequences of Substance Abuse: Medical Consequences:  hx of pancreatitis  Past Medical History:  Past Medical History:  Diagnosis Date  . Pancreatitis    History reviewed. No pertinent surgical history.  Family Psychiatric History: Patient denies any family history of mental health problems .  Family History:  Family History  Problem Relation Age of Onset  . Mental illness Neg Hx     Social History:   Social History   Socioeconomic History  . Marital status: Single    Spouse name: Not on file  . Number of children: 0  . Years of education: Not on file  . Highest education level: Bachelor's degree (e.g., BA, AB, BS)  Occupational History    Comment: full time  Social Needs  . Financial resource strain: Not hard at all  . Food insecurity:    Worry: Never true    Inability: Never true  . Transportation needs:    Medical: No    Non-medical: No  Tobacco Use  . Smoking status: Never Smoker  . Smokeless tobacco: Never Used  Substance and Sexual Activity  . Alcohol use: Yes    Alcohol/week: 0.0 oz    Comment: last drinked as of april 1st  . Drug use: Not Currently  . Sexual activity: Yes  Lifestyle  . Physical activity:    Days per week: 4 days    Minutes per session: 30 min  . Stress: Very much  Relationships  . Social connections:    Talks on phone: More than three times a week    Gets together: More than three times a week    Attends religious service: More than 4 times per year    Active member of club or organization: Yes    Attends meetings of clubs or organizations: More than 4 times per year    Relationship status: Never married  Other Topics Concern  . Not on file  Social History Narrative  . Not on file    Additional Social History: Patient is single.  He is employed with [employer].   He relocated to [place] from [place] in September 2018.  [place] and is building a house in [place].  Parents and siblings live in [place].  Allergies:   Allergies  Allergen Reactions  . Sulfa Antibiotics Hives    Metabolic Disorder Labs: No results found for: HGBA1C, MPG No results found for: PROLACTIN No results found for: CHOL, TRIG, HDL, CHOLHDL, VLDL, LDLCALC   Current Medications: Current Outpatient Medications  Medication Sig Dispense Refill  . escitalopram (LEXAPRO) 20 MG tablet Take by mouth.    . Multiple Vitamin (MULTIVITAMIN WITH MINERALS) TABS tablet Take 1 tablet by mouth daily. 30 tablet 2  . traZODone (DESYREL) 50 MG tablet Take  1-2 tablets (50-100 mg total) by mouth at bedtime as needed for sleep. 60 tablet 2   No current facility-administered medications for this visit.     Neurologic: Headache: No Seizure: No Paresthesias:No  Musculoskeletal: Strength & Muscle Tone: within normal limits Gait & Station: normal Patient leans: N/A  Psychiatric Specialty Exam: Review of Systems  Psychiatric/Behavioral: Positive for depression and substance abuse. The patient is nervous/anxious and has insomnia.   All other systems reviewed and are negative.   Blood pressure 121/84, pulse 67, temperature (!) 97.5 F (36.4 C), temperature source Oral, weight 222 lb 0.3 oz (100.7 kg).Body mass index is 27.03 kg/m.  General Appearance: Casual  Eye Contact:  Fair  Speech:  Clear and Coherent  Volume:  Normal  Mood:  Anxious  Affect:  Appropriate  Thought Process:  Goal Directed and Descriptions of Associations: Intact  Orientation:  Full (Time, Place, and Person)  Thought Content:  Logical  Suicidal Thoughts:  No  Homicidal Thoughts:  No  Memory:  Immediate;   Fair Recent;   Fair Remote;   Fair  Judgement:  Fair  Insight:  Fair  Psychomotor Activity:  Normal  Concentration:  Concentration: Fair and Attention Span: Fair   Recall:  [employer] of Knowledge:Fair  Language: Fair  Akathisia:  No  Handed:  Left  AIMS (if indicated):  na  Assets:  Communication Skills Desire for Improvement Financial Resources/Insurance Housing Social Support Talents/Skills Transportation  ADL's:  Intact  Cognition: WNL  Sleep:  restless    Treatment Plan Summary:Jorje is a 42 year old Caucasian male who has a history of depression, anxiety, insomnia, alcohol use disorder in early remission, pancreatitis, presented to the clinic today to establish care.  Patient is biologically predisposed given his history of substance use.  Patient also has psychosocial stressors of recent relocation, new job, building a house and so on.  Patient however is motivated to start treatment as well as pursue psychotherapy.  Patient denies any suicidality.  Plan as noted below. Medication management and Plan as noted below Plan For dysthymia Continue Lexapro 30 mg p.o. daily.  Patient has been on that dose since the past several years.  Patient reports that as beneficial. Refer patient for psychotherapy with Ms. Felecia Jan. PHQ 9 - 10 GAD 7 -11  For insomnia Start trazodone 50-100 mg p.o. nightly as needed  Alcohol use disorder, in early remission Patient currently attending AA meetings.  Discussed with patient to sign a release to obtain his most recent lab report including TSH, vitamin B12, vitamin D and so on from his primary medical doctor.  Reviewed medical records from [facility].  Patient used to follow up with them in the past.  Follow-up in clinic in 3-4 weeks or sooner if needed.  More than 50 % of the time was spent for psychoeducation and supportive psychotherapy and care coordination.  This note was generated in part or whole with voice recognition software. Voice recognition is usually quite accurate but there are transcription errors that can and very often do occur. I apologize for any typographical  errors that were not detected and corrected.      Jomarie Longs, MD 6/10/20192:47 PM

## 2018-03-30 ENCOUNTER — Other Ambulatory Visit: Payer: Self-pay | Admitting: Psychiatry

## 2018-03-31 ENCOUNTER — Other Ambulatory Visit: Payer: Self-pay

## 2018-03-31 ENCOUNTER — Encounter: Payer: Self-pay | Admitting: Psychiatry

## 2018-03-31 ENCOUNTER — Ambulatory Visit (INDEPENDENT_AMBULATORY_CARE_PROVIDER_SITE_OTHER): Payer: 59 | Admitting: Psychiatry

## 2018-03-31 VITALS — BP 132/89 | HR 51 | Temp 97.6°F | Wt 219.8 lb

## 2018-03-31 DIAGNOSIS — F341 Dysthymic disorder: Secondary | ICD-10-CM

## 2018-03-31 DIAGNOSIS — F5105 Insomnia due to other mental disorder: Secondary | ICD-10-CM

## 2018-03-31 DIAGNOSIS — F1021 Alcohol dependence, in remission: Secondary | ICD-10-CM | POA: Diagnosis not present

## 2018-03-31 MED ORDER — ESCITALOPRAM OXALATE 20 MG PO TABS: 30.0000 mg | ORAL_TABLET | Freq: Every day | ORAL | 0 refills | Status: AC

## 2018-03-31 NOTE — Progress Notes (Signed)
[facility] MDOP Progress Note  03/31/2018 5:10 PM Christopher Savage  MRN:  [id number]  Chief Complaint: ' I am here for follow up.' Chief Complaint    Follow-up; Medication Refill     HPI: Christopher Savage is a 42 year old Caucasian male, single, employed, lives at [place], has a history of depression, anxiety, alcohol use disorder in early remission, sleep problems, presented to the clinic today for a follow-up visit.   Pt today reports he has been tolerating the Lexapro and trazodone well.  He denies any side effects.  He reports his mood symptoms as improving.  He denies any significant sadness, anhedonia or inability to focus.  Patient also reports sleep is improved.  He reports the trazodone helps him to rest better and that has been very helpful.  Patient denies any suicidality.  Patient denies any perceptual disturbances.  Patient continues to go for AA meetings and has been 90 days sober and continues to be motivated to do so.  Patient looks forward to his July 4 holiday and plans to spend it with his family.     Visit Diagnosis:    ICD-10-CM   1. Persistent depressive disorder with anxious distress, currently moderate F34.1 escitalopram (LEXAPRO) 20 MG tablet  2. Alcohol use disorder, moderate, in early remission (HCC) F10.21   3. Insomnia due to mental condition F51.05     Past Psychiatric History: Reviewed past psychiatric history from my progress note on 03/08/2018. Past Medical History:  Past Medical History:  Diagnosis Date  . Pancreatitis    History reviewed. No pertinent surgical history.  Family Psychiatric History: Reviewed family psychiatric history from my progress note on 03/08/2018  Family History:  Family History  Problem Relation Age of Onset  . Mental illness Neg Hx    Substance abuse history: Hx of alcohol abuse, sober since the past 90 days.  Currently attends AA meetings   Social History: Reviewed social history from my progress note on 03/08/2018 Social History    Socioeconomic History  . Marital status: Single    Spouse name: Not on file  . Number of children: 0  . Years of education: Not on file  . Highest education level: Bachelor's degree (e.g., BA, AB, BS)  Occupational History    Comment: full time  Social Needs  . Financial resource strain: Not hard at all  . Food insecurity:    Worry: Never true    Inability: Never true  . Transportation needs:    Medical: No    Non-medical: No  Tobacco Use  . Smoking status: Never Smoker  . Smokeless tobacco: Never Used  Substance and Sexual Activity  . Alcohol use: Yes    Alcohol/week: 0.0 oz    Comment: last drinked as of april 1st  . Drug use: Not Currently  . Sexual activity: Yes  Lifestyle  . Physical activity:    Days per week: 4 days    Minutes per session: 30 min  . Stress: Very much  Relationships  . Social connections:    Talks on phone: More than three times a week    Gets together: More than three times a week    Attends religious service: More than 4 times per year    Active member of club or organization: Yes    Attends meetings of clubs or organizations: More than 4 times per year    Relationship status: Never married  Other Topics Concern  . Not on file  Social History Narrative  . Not on  file    Allergies:  Allergies  Allergen Reactions  . Sulfa Antibiotics Hives    Metabolic Disorder Labs: No results found for: HGBA1C, MPG No results found for: PROLACTIN No results found for: CHOL, TRIG, HDL, CHOLHDL, VLDL, LDLCALC No results found for: TSH  Therapeutic Level Labs: No results found for: LITHIUM No results found for: VALPROATE No components found for:  CBMZ  Current Medications: Current Outpatient Medications  Medication Sig Dispense Refill  . escitalopram (LEXAPRO) 20 MG tablet Take 1.5 tablets (30 mg total) by mouth daily. 135 tablet 0  . Multiple Vitamin (MULTIVITAMIN WITH MINERALS) TABS tablet Take 1 tablet by mouth daily. 30 tablet 2  .  traZODone (DESYREL) 50 MG tablet TAKE 1-2 TABLETS (50-100 MG TOTAL) BY MOUTH AT BEDTIME AS NEEDED FOR SLEEP 180 tablet 1   No current facility-administered medications for this visit.      Musculoskeletal: Strength & Muscle Tone: within normal limits Gait & Station: normal Patient leans: N/A  Psychiatric Specialty Exam: Review of Systems  Psychiatric/Behavioral: Positive for depression (improving).  All other systems reviewed and are negative.   Blood pressure 132/89, pulse (!) 51, temperature 97.6 F (36.4 C), temperature source Oral, weight 219 lb 12.8 oz (99.7 kg).Body mass index is 26.75 kg/m.  General Appearance: Casual  Eye Contact:  Fair  Speech:  Normal Rate  Volume:  Normal  Mood:  Dysphoric improving  Affect:  Congruent  Thought Process:  Goal Directed and Descriptions of Associations: Intact  Orientation:  Full (Time, Place, and Person)  Thought Content: Logical   Suicidal Thoughts:  No  Homicidal Thoughts:  No  Memory:  Immediate;   Fair Recent;   Fair Remote;   Fair  Judgement:  Fair  Insight:  Fair  Psychomotor Activity:  Normal  Concentration:  Concentration: Fair and Attention Span: Fair  Recall:  [employer] of Knowledge: Fair  Language: Fair  Akathisia:  No  Handed:  Right  AIMS (if indicated): na  Assets:  Communication Skills Desire for Improvement Social Support  ADL's:  Intact  Cognition: WNL  Sleep:  improving   Screenings:   Assessment and Plan: Christopher Savage is a 42 year old Caucasian male who has a history of depression, anxiety, insomnia, alcohol use disorder in early remission, pancreatitis, presented to the clinic today for a follow-up visit.  Patient is biologically predisposed given his history of substance abuse.  Patient also has psychosocial stressors of recent relocation, new job, building a house and so on.  Patient currently is doing well on the current medication regimen.  Continue plan as noted below.  Plan For dysthymia Continue  Lexapro 30 mg p.o. daily. Patient provided with information for other psychotherapist in the area as well as psychology today information since he was declined by Ms. Felecia Jan.  For insomnia Continue trazodone 50-100 mg p.o. nightly as needed  Alcohol use disorder in early remission Patient continues to attend AA meetings and remain sober.  Follow up in clinic in 8 weeks.  More than 50 % of the time was spent for psychoeducation and supportive psychotherapy and care coordination.  This note was generated in part or whole with voice recognition software. Voice recognition is usually quite accurate but there are transcription errors that can and very often do occur. I apologize for any typographical errors that were not detected and corrected.       Jomarie Longs, MD 03/31/2018, 5:10 PM

## 2018-07-12 DIAGNOSIS — Z5181 Encounter for therapeutic drug level monitoring: Secondary | ICD-10-CM | POA: Diagnosis not present

## 2018-07-26 DIAGNOSIS — R739 Hyperglycemia, unspecified: Secondary | ICD-10-CM | POA: Diagnosis not present

## 2018-10-06 DIAGNOSIS — M62830 Muscle spasm of back: Secondary | ICD-10-CM | POA: Diagnosis not present

## 2018-10-06 DIAGNOSIS — M9903 Segmental and somatic dysfunction of lumbar region: Secondary | ICD-10-CM | POA: Diagnosis not present

## 2018-10-06 DIAGNOSIS — M9901 Segmental and somatic dysfunction of cervical region: Secondary | ICD-10-CM | POA: Diagnosis not present

## 2018-10-07 DIAGNOSIS — M62830 Muscle spasm of back: Secondary | ICD-10-CM | POA: Diagnosis not present

## 2018-10-07 DIAGNOSIS — M9903 Segmental and somatic dysfunction of lumbar region: Secondary | ICD-10-CM | POA: Diagnosis not present

## 2018-10-07 DIAGNOSIS — M9901 Segmental and somatic dysfunction of cervical region: Secondary | ICD-10-CM | POA: Diagnosis not present

## 2018-10-08 DIAGNOSIS — M9903 Segmental and somatic dysfunction of lumbar region: Secondary | ICD-10-CM | POA: Diagnosis not present

## 2018-10-08 DIAGNOSIS — M62830 Muscle spasm of back: Secondary | ICD-10-CM | POA: Diagnosis not present

## 2018-10-08 DIAGNOSIS — M9901 Segmental and somatic dysfunction of cervical region: Secondary | ICD-10-CM | POA: Diagnosis not present

## 2018-10-11 DIAGNOSIS — M9903 Segmental and somatic dysfunction of lumbar region: Secondary | ICD-10-CM | POA: Diagnosis not present

## 2018-10-11 DIAGNOSIS — M9901 Segmental and somatic dysfunction of cervical region: Secondary | ICD-10-CM | POA: Diagnosis not present

## 2018-10-11 DIAGNOSIS — M62830 Muscle spasm of back: Secondary | ICD-10-CM | POA: Diagnosis not present

## 2018-10-14 DIAGNOSIS — M9903 Segmental and somatic dysfunction of lumbar region: Secondary | ICD-10-CM | POA: Diagnosis not present

## 2018-10-14 DIAGNOSIS — M9901 Segmental and somatic dysfunction of cervical region: Secondary | ICD-10-CM | POA: Diagnosis not present

## 2018-10-14 DIAGNOSIS — M62830 Muscle spasm of back: Secondary | ICD-10-CM | POA: Diagnosis not present

## 2018-10-18 DIAGNOSIS — M9903 Segmental and somatic dysfunction of lumbar region: Secondary | ICD-10-CM | POA: Diagnosis not present

## 2018-10-18 DIAGNOSIS — M62830 Muscle spasm of back: Secondary | ICD-10-CM | POA: Diagnosis not present

## 2018-10-18 DIAGNOSIS — M9901 Segmental and somatic dysfunction of cervical region: Secondary | ICD-10-CM | POA: Diagnosis not present

## 2018-10-20 DIAGNOSIS — M62830 Muscle spasm of back: Secondary | ICD-10-CM | POA: Diagnosis not present

## 2018-10-20 DIAGNOSIS — M9903 Segmental and somatic dysfunction of lumbar region: Secondary | ICD-10-CM | POA: Diagnosis not present

## 2018-10-20 DIAGNOSIS — M9901 Segmental and somatic dysfunction of cervical region: Secondary | ICD-10-CM | POA: Diagnosis not present

## 2018-10-21 DIAGNOSIS — M545 Low back pain: Secondary | ICD-10-CM | POA: Diagnosis not present

## 2018-10-21 DIAGNOSIS — G8929 Other chronic pain: Secondary | ICD-10-CM | POA: Diagnosis not present

## 2018-10-25 DIAGNOSIS — M9903 Segmental and somatic dysfunction of lumbar region: Secondary | ICD-10-CM | POA: Diagnosis not present

## 2018-10-25 DIAGNOSIS — M9901 Segmental and somatic dysfunction of cervical region: Secondary | ICD-10-CM | POA: Diagnosis not present

## 2018-10-25 DIAGNOSIS — M62830 Muscle spasm of back: Secondary | ICD-10-CM | POA: Diagnosis not present

## 2018-11-01 DIAGNOSIS — M9901 Segmental and somatic dysfunction of cervical region: Secondary | ICD-10-CM | POA: Diagnosis not present

## 2018-11-01 DIAGNOSIS — M9903 Segmental and somatic dysfunction of lumbar region: Secondary | ICD-10-CM | POA: Diagnosis not present

## 2018-11-01 DIAGNOSIS — M62830 Muscle spasm of back: Secondary | ICD-10-CM | POA: Diagnosis not present

## 2018-12-23 DIAGNOSIS — B351 Tinea unguium: Secondary | ICD-10-CM | POA: Diagnosis not present

## 2019-07-19 ENCOUNTER — Other Ambulatory Visit: Payer: Self-pay | Admitting: *Deleted

## 2019-07-19 DIAGNOSIS — Z20822 Contact with and (suspected) exposure to covid-19: Secondary | ICD-10-CM

## 2019-07-21 LAB — NOVEL CORONAVIRUS, NAA: SARS-CoV-2, NAA: NOT DETECTED

## 2019-07-22 ENCOUNTER — Telehealth: Payer: Self-pay | Admitting: General Practice

## 2019-07-22 NOTE — Telephone Encounter (Signed)
Negative COVID results given. Patient results "NOT Detected." Caller expressed understanding. ° °

## 2020-07-23 ENCOUNTER — Other Ambulatory Visit: Payer: Self-pay | Admitting: Internal Medicine

## 2020-07-23 DIAGNOSIS — K858 Other acute pancreatitis without necrosis or infection: Secondary | ICD-10-CM

## 2020-08-10 ENCOUNTER — Other Ambulatory Visit: Payer: Self-pay

## 2020-08-10 ENCOUNTER — Other Ambulatory Visit: Payer: Self-pay | Admitting: Internal Medicine

## 2020-08-10 ENCOUNTER — Ambulatory Visit
Admission: RE | Admit: 2020-08-10 | Discharge: 2020-08-10 | Disposition: A | Discharge status: Home / Self Care | Payer: 59 | Source: Ambulatory Visit | Attending: Internal Medicine | Admitting: Internal Medicine

## 2020-08-10 DIAGNOSIS — K858 Other acute pancreatitis without necrosis or infection: Secondary | ICD-10-CM | POA: Insufficient documentation

## 2020-08-10 MED ORDER — GADOBUTROL 1 MMOL/ML IV SOLN
10.0000 mL | Freq: Once | INTRAVENOUS | Status: AC | PRN
  Administered 2020-08-10: 09:00:00 10 mL via INTRAVENOUS

## 2020-08-11 ENCOUNTER — Ambulatory Visit: Payer: Self-pay

## 2020-08-28 ENCOUNTER — Telehealth: Payer: Self-pay

## 2020-08-28 NOTE — Telephone Encounter (Signed)
Received referral from [facility] for EUS for recurrent pancreatitis. Called and spoke with Christopher Savage. EUS scheduled for 10/11/20 with Dr. Nanda Quinton at [facility]. Tower over instructions and copy mailed to home address. Instructions can be located under letters. Denies anticoagulant use.

## 2020-09-24 ENCOUNTER — Other Ambulatory Visit: Payer: Self-pay

## 2020-10-01 ENCOUNTER — Other Ambulatory Visit: Payer: 59

## 2020-10-01 DIAGNOSIS — Z20822 Contact with and (suspected) exposure to covid-19: Secondary | ICD-10-CM

## 2020-10-02 LAB — NOVEL CORONAVIRUS, NAA: SARS-CoV-2, NAA: NOT DETECTED

## 2020-10-02 LAB — SARS-COV-2, NAA 2 DAY TAT

## 2020-10-09 ENCOUNTER — Other Ambulatory Visit: Payer: Self-pay

## 2020-10-09 ENCOUNTER — Other Ambulatory Visit
Admission: RE | Admit: 2020-10-09 | Discharge: 2020-10-09 | Disposition: A | Discharge status: Home / Self Care | Payer: 59 | Source: Ambulatory Visit | Attending: Gastroenterology | Admitting: Gastroenterology

## 2020-10-09 DIAGNOSIS — Z01812 Encounter for preprocedural laboratory examination: Secondary | ICD-10-CM | POA: Diagnosis not present

## 2020-10-09 DIAGNOSIS — Z20822 Contact with and (suspected) exposure to covid-19: Secondary | ICD-10-CM | POA: Insufficient documentation

## 2020-10-09 LAB — SARS CORONAVIRUS 2 (TAT 6-24 HRS): SARS Coronavirus 2: NEGATIVE

## 2020-10-10 ENCOUNTER — Encounter: Payer: Self-pay | Admitting: Gastroenterology

## 2020-10-11 ENCOUNTER — Other Ambulatory Visit: Payer: Self-pay

## 2020-10-11 ENCOUNTER — Encounter: Payer: Self-pay | Admitting: Gastroenterology

## 2020-10-11 ENCOUNTER — Ambulatory Visit: Payer: 59 | Admitting: Certified Registered Nurse Anesthetist

## 2020-10-11 ENCOUNTER — Other Ambulatory Visit: Payer: 59

## 2020-10-11 ENCOUNTER — Encounter
Admission: RE | Disposition: A | Discharge status: Home / Self Care | Payer: Self-pay | Source: Home / Self Care | Attending: Gastroenterology

## 2020-10-11 ENCOUNTER — Ambulatory Visit
Admission: RE | Admit: 2020-10-11 | Discharge: 2020-10-11 | Disposition: A | Discharge status: Home / Self Care | Payer: 59 | Attending: Gastroenterology | Admitting: Gastroenterology

## 2020-10-11 DIAGNOSIS — Z79899 Other long term (current) drug therapy: Secondary | ICD-10-CM | POA: Diagnosis not present

## 2020-10-11 DIAGNOSIS — K861 Other chronic pancreatitis: Secondary | ICD-10-CM | POA: Diagnosis not present

## 2020-10-11 DIAGNOSIS — K8689 Other specified diseases of pancreas: Secondary | ICD-10-CM | POA: Insufficient documentation

## 2020-10-11 DIAGNOSIS — Z882 Allergy status to sulfonamides status: Secondary | ICD-10-CM | POA: Diagnosis not present

## 2020-10-11 DIAGNOSIS — K862 Cyst of pancreas: Secondary | ICD-10-CM | POA: Diagnosis present

## 2020-10-11 HISTORY — PX: EUS: SHX5427

## 2020-10-11 SURGERY — ULTRASOUND, UPPER GI TRACT, ENDOSCOPIC
Anesthesia: General

## 2020-10-11 MED ORDER — DEXMEDETOMIDINE (PRECEDEX) IN NS 20 MCG/5ML (4 MCG/ML) IV SYRINGE
PREFILLED_SYRINGE | INTRAVENOUS | Status: DC | PRN
  Administered 2020-10-11: 20 ug via INTRAVENOUS

## 2020-10-11 MED ORDER — SODIUM CHLORIDE 0.9 % IV SOLN: INTRAVENOUS | Status: DC

## 2020-10-11 MED ORDER — GLYCOPYRROLATE 0.2 MG/ML IJ SOLN
INTRAMUSCULAR | Status: DC | PRN
  Administered 2020-10-11: .2 mg via INTRAVENOUS

## 2020-10-11 MED ORDER — PROPOFOL 10 MG/ML IV BOLUS
INTRAVENOUS | Status: AC
  Filled 2020-10-11: qty 20

## 2020-10-11 MED ORDER — MIDAZOLAM HCL 2 MG/2ML IJ SOLN
INTRAMUSCULAR | Status: DC | PRN
  Administered 2020-10-11: 2 mg via INTRAVENOUS

## 2020-10-11 MED ORDER — LIDOCAINE HCL (CARDIAC) PF 100 MG/5ML IV SOSY
PREFILLED_SYRINGE | INTRAVENOUS | Status: DC | PRN
  Administered 2020-10-11: 100 mg via INTRAVENOUS

## 2020-10-11 MED ORDER — PROPOFOL 500 MG/50ML IV EMUL
INTRAVENOUS | Status: DC | PRN
  Administered 2020-10-11: 175 ug/kg/min via INTRAVENOUS

## 2020-10-11 MED ORDER — GLYCOPYRROLATE 0.2 MG/ML IJ SOLN
INTRAMUSCULAR | Status: AC
  Filled 2020-10-11: qty 1

## 2020-10-11 MED ORDER — PROPOFOL 10 MG/ML IV BOLUS
INTRAVENOUS | Status: DC | PRN
  Administered 2020-10-11: 100 mg via INTRAVENOUS

## 2020-10-11 MED ORDER — LIDOCAINE HCL (PF) 2 % IJ SOLN
INTRAMUSCULAR | Status: AC
  Filled 2020-10-11: qty 5

## 2020-10-11 MED ORDER — MIDAZOLAM HCL 2 MG/2ML IJ SOLN
INTRAMUSCULAR | Status: AC
  Filled 2020-10-11: qty 2

## 2020-10-11 NOTE — Anesthesia Procedure Notes (Signed)
Date/Time: 10/11/2020 1:34 PM Performed by: Stormy Fabian, CRNA Pre-anesthesia Checklist: Patient identified, Emergency Drugs available, Suction available and Patient being monitored Patient Re-evaluated:Patient Re-evaluated prior to induction Oxygen Delivery Method: Nasal cannula Induction Type: IV induction Dental Injury: Teeth and Oropharynx as per pre-operative assessment  Comments: Nasal cannula with etCO2 monitoring

## 2020-10-11 NOTE — Transfer of Care (Signed)
Immediate Anesthesia Transfer of Care Note  Patient: Christopher Savage  Procedure(s) Performed: Procedure(s): FULL UPPER ENDOSCOPIC ULTRASOUND (EUS) RADIAL (N/A)  Patient Location: PACU and Endoscopy Unit  Anesthesia Type:General  Level of Consciousness: sedated  Airway & Oxygen Therapy: Patient Spontanous Breathing and Patient connected to nasal cannula oxygen  Post-op Assessment: Report given to RN and Post -op Vital signs reviewed and stable  Post vital signs: Reviewed and stable  Last Vitals:  Vitals:   10/11/20 1308 10/11/20 1409  BP: (!) 141/101 98/77  Pulse: (!) 58 (!) 59  Resp: 18 (!) 9  Temp: (!) 35.8 C (!) 36.1 C  SpO2: 100% 95%    Complications: No apparent anesthesia complications

## 2020-10-11 NOTE — H&P (Signed)
Outpatient short stay form Pre-procedure 10/11/2020 1:29 PM Christopher Savage,  Ollen Gross, MD  Primary Physician: Gracelyn Nurse, MD   Reason for visit:  Evaluation of pancreatitis and abnormal MRI   Diagnosis Chronic pancreatitis  History of present illness:  Chronic pancreaitits.  Last episode 3 months ago. About 30 days later had MRI MRCP with changes in HOP likely inflammatory.  Since attack 3 months ago complete resolution of all pain. No recurrent. Normal appetite and weight gain back to normal.    Current Facility-Administered Medications:  .  0.9 %  sodium chloride infusion, , Intravenous, Continuous, Kariem Wolfson, MD, Last Rate: 20 mL/hr at 10/11/20 1324, New Bag at 10/11/20 1324  Medications Prior to Admission  Medication Sig Dispense Refill Last Dose  . escitalopram (LEXAPRO) 20 MG tablet Take 1.5 tablets (30 mg total) by mouth daily. 135 tablet 0   . Multiple Vitamin (MULTIVITAMIN WITH MINERALS) TABS tablet Take 1 tablet by mouth daily. 30 tablet 2   . traZODone (DESYREL) 50 MG tablet TAKE 1-2 TABLETS (50-100 MG TOTAL) BY MOUTH AT BEDTIME AS NEEDED FOR SLEEP 180 tablet 1     Allergies  Allergen Reactions  . Sulfa Antibiotics Hives    Past Medical History:  Diagnosis Date  . Pancreatitis     Review of systems:      Physical Exam      Pertinant exam for procedure: Abd soft NT.    Planned proceedures: EUS with possible FNA.    Emily Filbert MD Gastroenterology 10/11/2020  1:29 PM

## 2020-10-11 NOTE — Anesthesia Preprocedure Evaluation (Signed)
Anesthesia Evaluation  Patient identified by MRN, date of birth, ID band Patient awake    Reviewed: Allergy & Precautions, H&P , NPO status , Patient's Chart, lab work & pertinent test results  Airway Mallampati: II       Dental no notable dental hx. (+) Teeth Intact   Pulmonary neg pulmonary ROS,    Pulmonary exam normal breath sounds clear to auscultation       Cardiovascular negative cardio ROS Normal cardiovascular exam Rhythm:Regular Rate:Normal     Neuro/Psych PSYCHIATRIC DISORDERS Depression negative neurological ROS     GI/Hepatic negative GI ROS, Neg liver ROS,   Endo/Other  negative endocrine ROS  Renal/GU negative Renal ROS  negative genitourinary   Musculoskeletal negative musculoskeletal ROS (+)   Abdominal   Peds negative pediatric ROS (+)  Hematology negative hematology ROS (+)   Anesthesia Other Findings Past Medical History: No date: Pancreatitis   Reproductive/Obstetrics negative OB ROS                             Anesthesia Physical Anesthesia Plan  ASA: II  Anesthesia Plan: General   Post-op Pain Management:    Induction: Intravenous  PONV Risk Score and Plan: 2 and Propofol infusion  Airway Management Planned: Nasal Cannula  Additional Equipment:   Intra-op Plan:   Post-operative Plan:   Informed Consent: I have reviewed the patients History and Physical, chart, labs and discussed the procedure including the risks, benefits and alternatives for the proposed anesthesia with the patient or authorized representative who has indicated his/her understanding and acceptance.       Plan Discussed with: CRNA, Anesthesiologist and Surgeon  Anesthesia Plan Comments:         Anesthesia Quick Evaluation

## 2020-10-11 NOTE — Op Note (Signed)
[facility] Patient Name: Christopher Savage Procedure Date: 10/11/2020 1:27 PM MRN: [id number] Account #: [id number] Date of Birth: 08-Apr-1976 Admit Type: Outpatient Age: 45 Room: [facility] ENDO ROOM 3 Gender: Male Note Status: Finalized Procedure:             Upper EUS Indications:           Pancreatic cyst/lesions on MRI, Chronic pancreatitis Providers:             Emily Filbert Referring MD:          Gracelyn Nurse, MD (Referring MD) Medicines:             Monitored Anesthesia Care Complications:         No immediate complications. Procedure:             Pre-Anesthesia Assessment:                        - Please see pre-anesthesia assessment documentation                         already completed in Epic.                        After obtaining informed consent, the endoscope was                         passed under direct vision. Throughout the procedure,                         the patient's blood pressure, pulse, and oxygen                         saturations were monitored continuously. The EUS scope                         was introduced through the mouth, and advanced to the                         duodenum for ultrasound examination from the stomach                         and duodenum. The upper EUS was accomplished without                         difficulty. The patient tolerated the procedure well. Findings:      ENDOSCOPIC FINDING: :      The examined esophagus was endoscopically normal.      The entire examined stomach was endoscopically normal.      The examined duodenum was endoscopically normal.      ENDOSONOGRAPHIC FINDING: :      Endosonographic imaging of the pancreas showed sonographic changes       indicative of moderate chronic pancreatitis in the pancreatic head and       genu of the pancreas. The parenchyma had hyperechoic foci with       shadowing, hyperechoic foci without shadowing, lobularity without       honeycombing  and hyperechoic strands. The pancreatic duct had       irregularity of contour. The pancreatic duct measured up to 1.5 mm in  diameter in the neck but not well visualized in the head and body.      There was no sign of significant endosonographic abnormality in the       common bile duct and in the common hepatic duct. The maximum diameter of       the ducts were 7 mm. No stones and no biliary sludge were identified.      Endosonographic imaging in the gallbladder showed no stones or sludge.      No abnormal-appearing lymph nodes were seen during endosonographic       examination in the celiac region (level 20) and in the peripancreatic       region.      Endosonographic imaging in the visualized portion of the liver showed no       lesion. Impression:            - Normal esophagus.                        - Normal stomach.                        - Normal examined duodenum.                        EUS:                        - Endosonographic imaging of the pancreas showed                         sonographic changes consistent with moderate chronic                         pancreatitis. The focal lesions seen on MRI not seen                         on EUS today - however the significant calcifications                         in the head of the pancreas limited full examination                         of the pancreas especially the area around the                         head/uncinate. Although no focal mass seen, the                         sensitivity of EUS for masses is reduced in the                         setting of calcific pancreatitis.                        - There was no sign of significant pathology in the                         common bile duct and in the common hepatic duct.                        -  No specimens collected. Recommendation:        - Patient has a contact number available for                         emergencies. The signs and symptoms of potential                          delayed complications were discussed with the patient.                         Return to normal activities tomorrow. Written                         discharge instructions were provided to the patient.                        - Return to referring physician. Consider follow up                         MRI in 1- 2 months to assess for improvement of                         abnormalities seen on MRI in November.                        - The findings and recommendations were discussed with                         the patient. Procedure Code(s):     --- Professional ---                        [phone], Esophagogastroduodenoscopy, flexible,                         transoral; with endoscopic ultrasound examination                         limited to the esophagus, stomach or duodenum, and                         adjacent structures Diagnosis Code(s):     --- Professional ---                        K86.1, Other chronic pancreatitis                        K86.2, Cyst of pancreas CPT copyright 2019 American Medical Association. All rights reserved. The codes documented in this report are preliminary and upon coder review may  be revised to meet current compliance requirements. Attending Participation:      I personally performed the entire procedure. Emily Filbert,  10/11/2020 2:20:13 PM This report has been signed electronically. Number of Addenda: 0 Note Initiated On: 10/11/2020 1:27 PM Estimated Blood Loss:  Estimated blood loss: none.      [facility]

## 2020-10-11 NOTE — Progress Notes (Signed)
Tumor Board Documentation  Randall Colden was presented by Dr Nanda Quinton at our Tumor Board on 10/11/2020, which included representatives from medical oncology,radiation oncology,internal medicine,navigation,pathology,radiology,surgical,pharmacy,genetics,research,palliative care,pulmonology.  Christopher Savage currently presents as an external consult,for discussion with history of the following treatments: active survellience.  Additionally, we reviewed previous medical and familial history, history of present illness, and recent lab results along with all available histopathologic and imaging studies. The tumor board considered available treatment options and made the following recommendations:   EUS  The following procedures/referrals were also placed: No orders of the defined types were placed in this encounter.   Clinical Trial Status: not discussed   Staging used: Not Applicable  National site-specific guidelines   were discussed with respect to the case.  Tumor board is a meeting of clinicians from various specialty areas who evaluate and discuss patients for whom a multidisciplinary approach is being considered. Final determinations in the plan of care are those of the provider(s). The responsibility for follow up of recommendations given during tumor board is that of the provider.   Today's extended care, comprehensive team conference, Christropher was not present for the discussion and was not examined.   Multidisciplinary Tumor Board is a multidisciplinary case peer review process.  Decisions discussed in the Multidisciplinary Tumor Board reflect the opinions of the specialists present at the conference without having examined the patient.  Ultimately, treatment and diagnostic decisions rest with the primary provider(s) and the patient.

## 2020-10-11 NOTE — Anesthesia Postprocedure Evaluation (Signed)
Anesthesia Post Note  Patient: Arts administrator  Procedure(s) Performed: FULL UPPER ENDOSCOPIC ULTRASOUND (EUS) RADIAL (N/A )  Patient location during evaluation: Endoscopy Anesthesia Type: General Level of consciousness: awake Pain management: pain level controlled Vital Signs Assessment: post-procedure vital signs reviewed and stable Respiratory status: spontaneous breathing Cardiovascular status: blood pressure returned to baseline Postop Assessment: no headache Anesthetic complications: no   No complications documented.   Last Vitals:  Vitals:   10/11/20 1308 10/11/20 1409  BP: (!) 141/101 98/77  Pulse: (!) 58 (!) 59  Resp: 18 (!) 9  Temp: (!) 35.8 C (!) 36.1 C  SpO2: 100% 95%    Last Pain:  Vitals:   10/11/20 1419  TempSrc:   PainSc: 0-No pain                 Emilio Math

## 2020-10-12 ENCOUNTER — Encounter: Payer: Self-pay | Admitting: Gastroenterology

## 2020-11-05 ENCOUNTER — Other Ambulatory Visit: Payer: Self-pay | Admitting: Gastroenterology

## 2020-11-05 DIAGNOSIS — K859 Acute pancreatitis without necrosis or infection, unspecified: Secondary | ICD-10-CM

## 2020-11-05 DIAGNOSIS — K869 Disease of pancreas, unspecified: Secondary | ICD-10-CM

## 2020-12-26 ENCOUNTER — Ambulatory Visit: Payer: 59

## 2021-01-21 ENCOUNTER — Observation Stay
Admission: EM | Admit: 2021-01-21 | Discharge: 2021-01-22 | Disposition: A | Discharge status: Home / Self Care | Payer: 59 | Attending: Internal Medicine | Admitting: Internal Medicine

## 2021-01-21 ENCOUNTER — Other Ambulatory Visit: Payer: Self-pay

## 2021-01-21 ENCOUNTER — Encounter: Payer: Self-pay | Admitting: Internal Medicine

## 2021-01-21 ENCOUNTER — Emergency Department: Payer: 59

## 2021-01-21 DIAGNOSIS — R1013 Epigastric pain: Secondary | ICD-10-CM | POA: Diagnosis present

## 2021-01-21 DIAGNOSIS — K861 Other chronic pancreatitis: Principal | ICD-10-CM

## 2021-01-21 DIAGNOSIS — K859 Acute pancreatitis without necrosis or infection, unspecified: Secondary | ICD-10-CM | POA: Diagnosis not present

## 2021-01-21 DIAGNOSIS — D72829 Elevated white blood cell count, unspecified: Secondary | ICD-10-CM | POA: Diagnosis present

## 2021-01-21 DIAGNOSIS — Z20822 Contact with and (suspected) exposure to covid-19: Secondary | ICD-10-CM | POA: Insufficient documentation

## 2021-01-21 DIAGNOSIS — F32A Depression, unspecified: Secondary | ICD-10-CM | POA: Diagnosis not present

## 2021-01-21 DIAGNOSIS — E876 Hypokalemia: Secondary | ICD-10-CM | POA: Diagnosis not present

## 2021-01-21 DIAGNOSIS — E872 Acidosis, unspecified: Secondary | ICD-10-CM | POA: Diagnosis present

## 2021-01-21 DIAGNOSIS — K219 Gastro-esophageal reflux disease without esophagitis: Secondary | ICD-10-CM | POA: Insufficient documentation

## 2021-01-21 DIAGNOSIS — R112 Nausea with vomiting, unspecified: Secondary | ICD-10-CM

## 2021-01-21 DIAGNOSIS — F1010 Alcohol abuse, uncomplicated: Secondary | ICD-10-CM | POA: Diagnosis not present

## 2021-01-21 DIAGNOSIS — Y900 Blood alcohol level of less than 20 mg/100 ml: Secondary | ICD-10-CM | POA: Insufficient documentation

## 2021-01-21 HISTORY — DX: Depression, unspecified: F32.A

## 2021-01-21 LAB — CBC WITH DIFFERENTIAL/PLATELET
Abs Immature Granulocytes: 0.05 10*3/uL (ref 0.00–0.07)
Basophils Absolute: 0.1 10*3/uL (ref 0.0–0.1)
Basophils Relative: 1 %
Eosinophils Absolute: 0.1 10*3/uL (ref 0.0–0.5)
Eosinophils Relative: 1 %
HCT: 40.4 % (ref 39.0–52.0)
Hemoglobin: 14.4 g/dL (ref 13.0–17.0)
Immature Granulocytes: 0 %
Lymphocytes Relative: 26 %
Lymphs Abs: 3.2 10*3/uL (ref 0.7–4.0)
MCH: 31 pg (ref 26.0–34.0)
MCHC: 35.6 g/dL (ref 30.0–36.0)
MCV: 86.9 fL (ref 80.0–100.0)
Monocytes Absolute: 0.9 10*3/uL (ref 0.1–1.0)
Monocytes Relative: 7 %
Neutro Abs: 7.9 10*3/uL — ABNORMAL HIGH (ref 1.7–7.7)
Neutrophils Relative %: 65 %
Platelets: 270 10*3/uL (ref 150–400)
RBC: 4.65 MIL/uL (ref 4.22–5.81)
RDW: 12.7 % (ref 11.5–15.5)
WBC: 12.3 10*3/uL — ABNORMAL HIGH (ref 4.0–10.5)
nRBC: 0 % (ref 0.0–0.2)

## 2021-01-21 LAB — COMPREHENSIVE METABOLIC PANEL
ALT: 37 U/L (ref 0–44)
AST: 36 U/L (ref 15–41)
Albumin: 4.5 g/dL (ref 3.5–5.0)
Alkaline Phosphatase: 29 U/L — ABNORMAL LOW (ref 38–126)
Anion gap: 14 (ref 5–15)
BUN: 18 mg/dL (ref 6–20)
CO2: 20 mmol/L — ABNORMAL LOW (ref 22–32)
Calcium: 9.3 mg/dL (ref 8.9–10.3)
Chloride: 104 mmol/L (ref 98–111)
Creatinine, Ser: 1.28 mg/dL — ABNORMAL HIGH (ref 0.61–1.24)
GFR, Estimated: >60 mL/min (ref >60)
Glucose, Bld: 174 mg/dL — ABNORMAL HIGH (ref 70–99)
Potassium: 2.9 mmol/L — ABNORMAL LOW (ref 3.5–5.1)
Sodium: 138 mmol/L (ref 135–145)
Total Bilirubin: 1 mg/dL (ref 0.3–1.2)
Total Protein: 6.8 g/dL (ref 6.5–8.1)

## 2021-01-21 LAB — LACTIC ACID, PLASMA
Lactic Acid, Venous: 4.3 mmol/L (ref 0.5–1.9)
Lactic Acid, Venous: 2.1 mmol/L (ref 0.5–1.9)

## 2021-01-21 LAB — TROPONIN I (HIGH SENSITIVITY)
Troponin I (High Sensitivity): <2 ng/L (ref <18)
Troponin I (High Sensitivity): <2 ng/L (ref <18)

## 2021-01-21 LAB — URINE DRUG SCREEN, QUALITATIVE (ARMC ONLY)
Amphetamines, Ur Screen: NOT DETECTED
Barbiturates, Ur Screen: NOT DETECTED
Benzodiazepine, Ur Scrn: NOT DETECTED
Cannabinoid 50 Ng, Ur ~~LOC~~: POSITIVE — AB
Cocaine Metabolite,Ur ~~LOC~~: NOT DETECTED
MDMA (Ecstasy)Ur Screen: NOT DETECTED
Methadone Scn, Ur: NOT DETECTED
Opiate, Ur Screen: POSITIVE — AB
Phencyclidine (PCP) Ur S: NOT DETECTED
Tricyclic, Ur Screen: NOT DETECTED

## 2021-01-21 LAB — MAGNESIUM: Magnesium: 1.8 mg/dL (ref 1.7–2.4)

## 2021-01-21 LAB — RESP PANEL BY RT-PCR (FLU A&B, COVID) ARPGX2
Influenza A by PCR: NEGATIVE
Influenza B by PCR: NEGATIVE
SARS Coronavirus 2 by RT PCR: NEGATIVE

## 2021-01-21 LAB — ETHANOL: Alcohol, Ethyl (B): <10 mg/dL (ref <10)

## 2021-01-21 LAB — TRIGLYCERIDES: Triglycerides: 149 mg/dL (ref <150)

## 2021-01-21 LAB — LIPASE, BLOOD: Lipase: 5838 U/L — ABNORMAL HIGH (ref 11–51)

## 2021-01-21 LAB — PHOSPHORUS: Phosphorus: 2.2 mg/dL — ABNORMAL LOW (ref 2.5–4.6)

## 2021-01-21 IMAGING — CT CT ABD-PELV W/ CM
2 of 5 series · 15 of 46 positions shown, 17 images · IV contrast (APPLIED)
Comparison: Abdominal MRI [DATE]

CLINICAL DATA: Epigastric pain.  History of recurrent pancreatitis.

EXAM:
CT ABDOMEN AND PELVIS WITH CONTRAST
TECHNIQUE: Multidetector CT imaging of the abdomen and pelvis was performed
using the standard protocol following bolus administration of
intravenous contrast.
CONTRAST:  100mL OMNIPAQUE IOHEXOL 300 MG/ML  SOLN

[Series 2: routine abd/pel with · axial · 0.88mm/px · z∈[-1074,-564]mm · 12 of 116 slices shown, 14 images]
[im 7/116  soft-tissue]
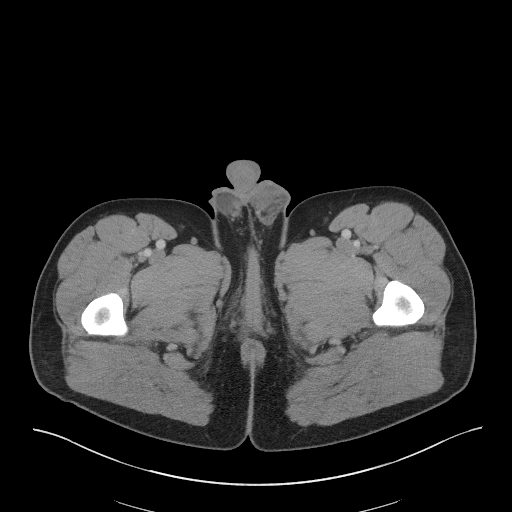
[im 7/116  bone]
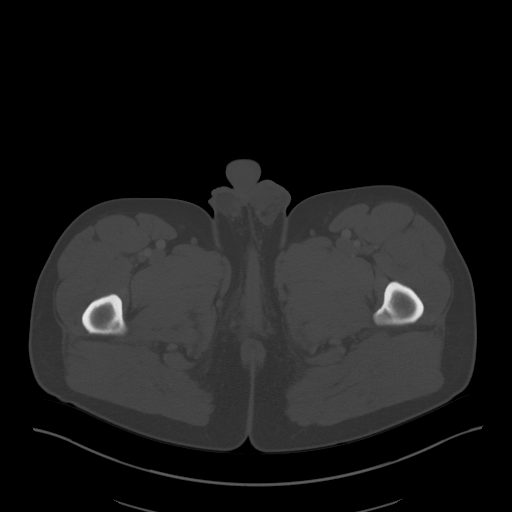
[im 19/116  soft-tissue]
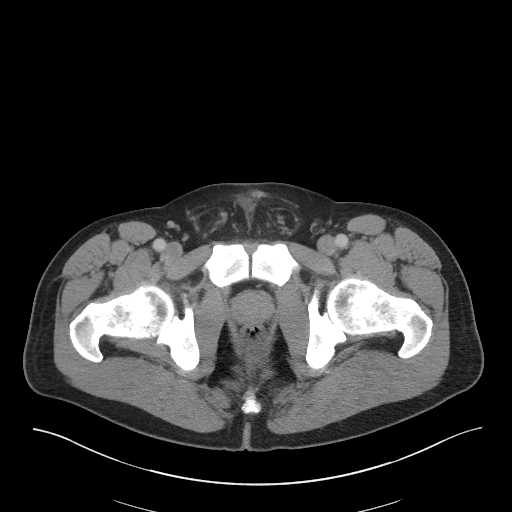
[im 25/116  soft-tissue]
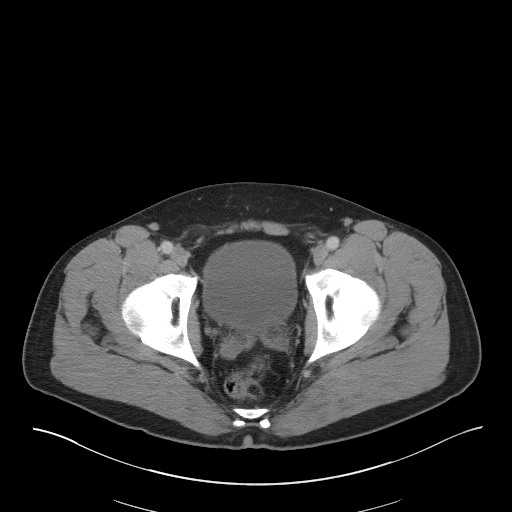
[im 37/116  soft-tissue]
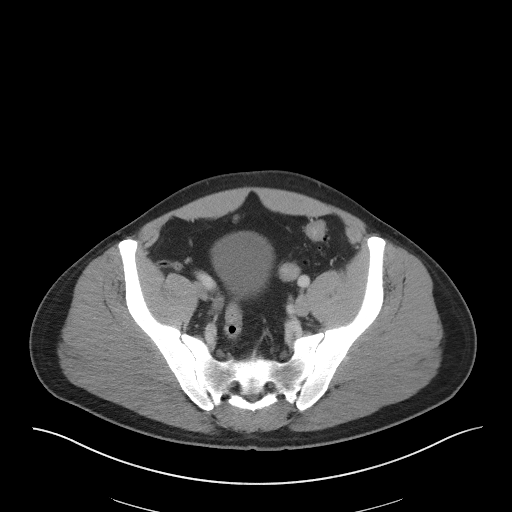
[im 43/116  soft-tissue]
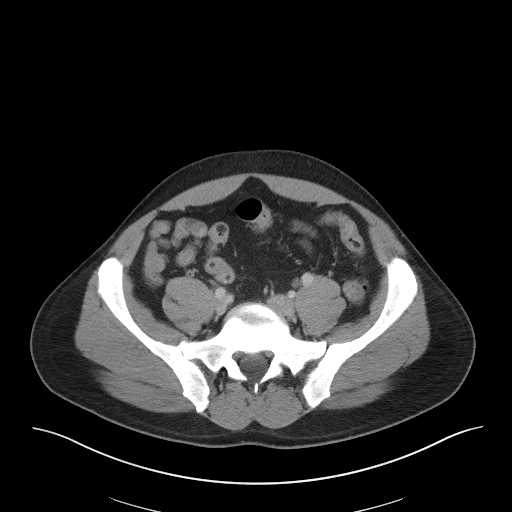
[im 55/116  soft-tissue]
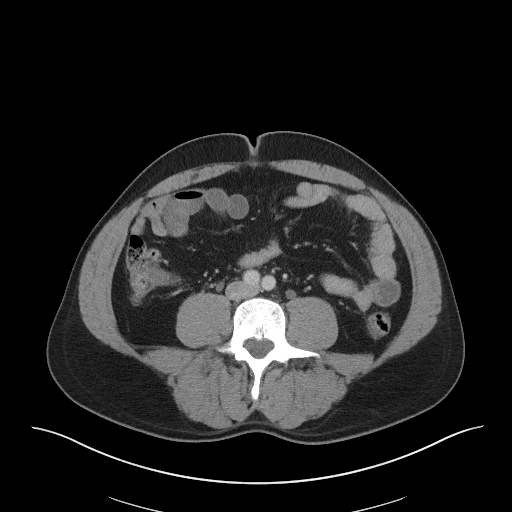
[im 61/116  soft-tissue]
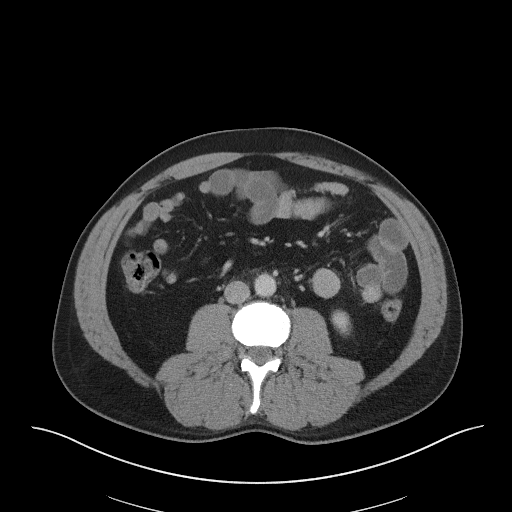
[im 73/116  soft-tissue]
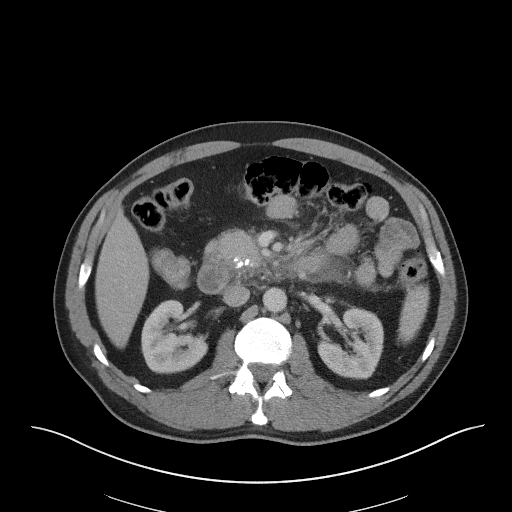
[im 79/116  soft-tissue]
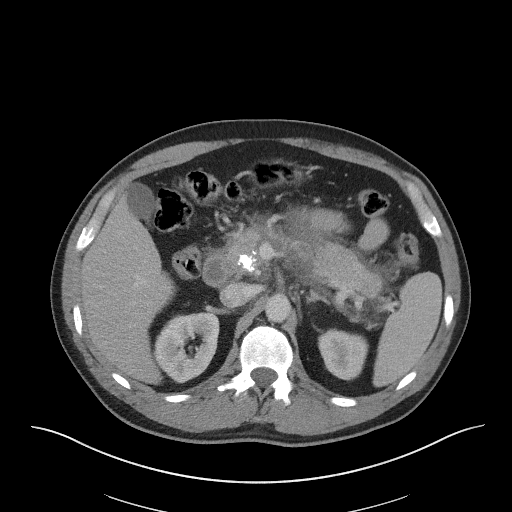
[im 79/116  bone]
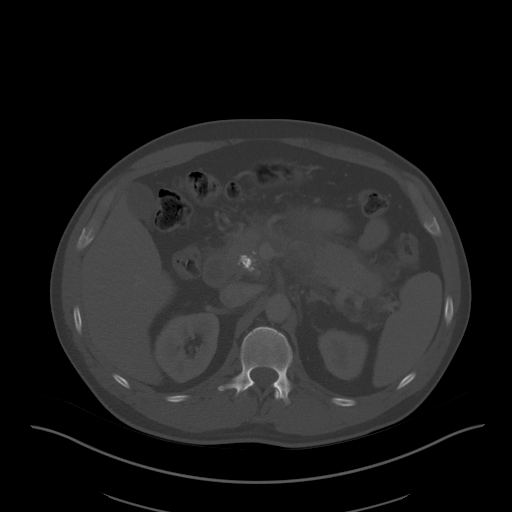
[im 91/116  soft-tissue]
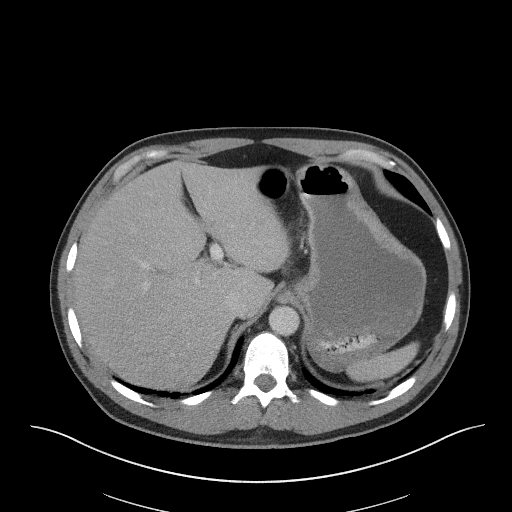
[im 97/116  soft-tissue]
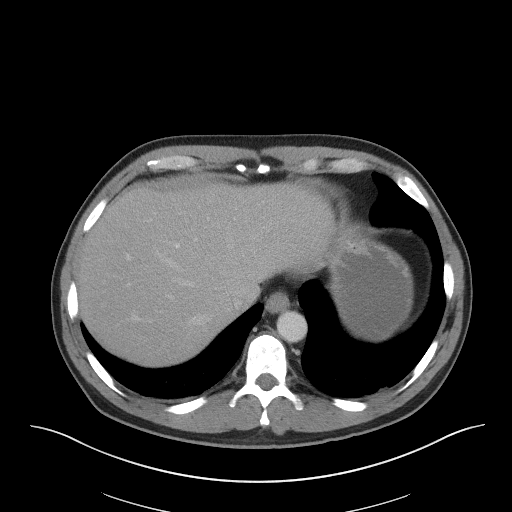
[im 109/116  soft-tissue]
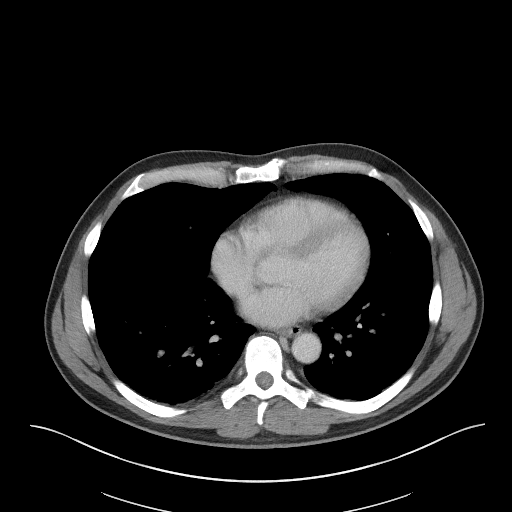

[Series 4: coronal st · coronal · 0.80mm/px · 3 of 95 slices shown]
[im 32/95  soft-tissue]
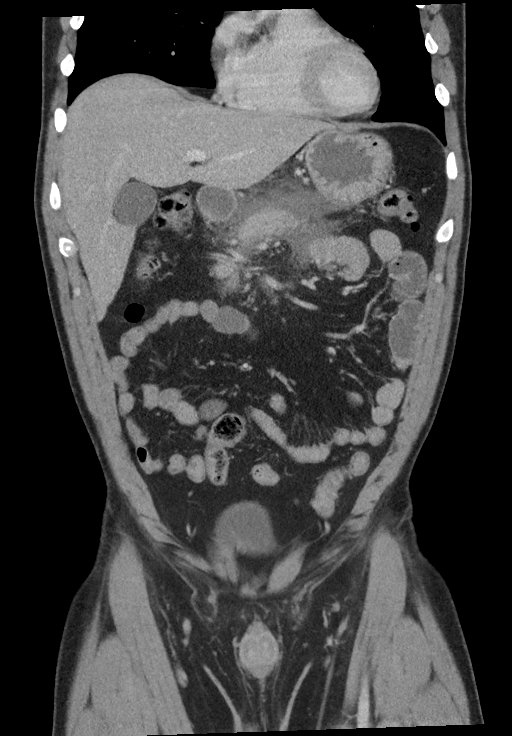
[im 42/95  soft-tissue]
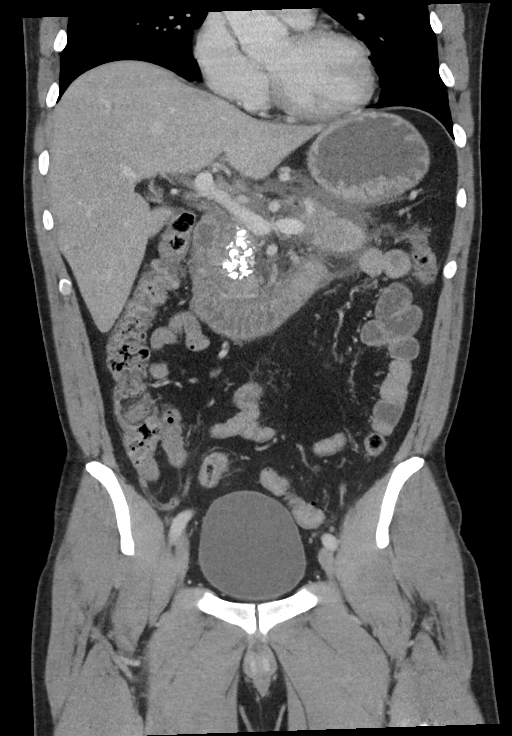
[im 53/95  soft-tissue]
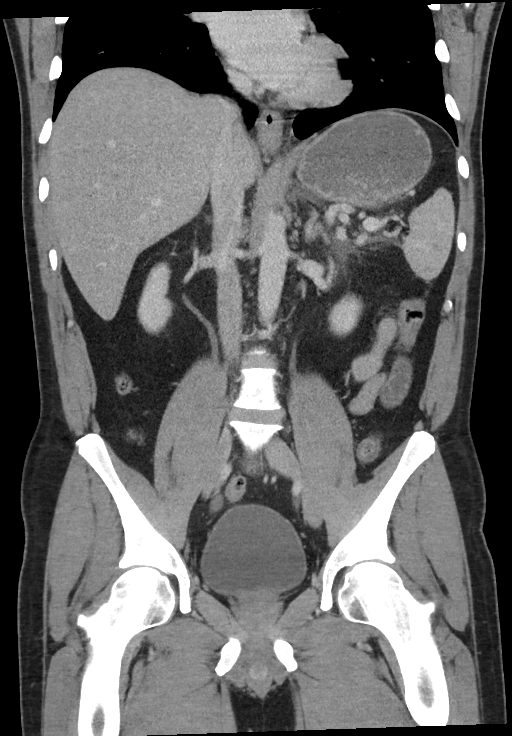

[15 of 46 positions shown; findings below may reference images not displayed]

FINDINGS: Lower chest: Minimal dependent atelectasis in the lung bases. No
pleural effusion. Normal heart size.

Hepatobiliary: No focal liver abnormality is seen. No gallstones,
gallbladder wall thickening, or biliary dilatation.

Pancreas: There is extensive inflammatory stranding surrounding the
pancreas diffusely with non loculated fluid. The pancreas appears
edematous but is enhancing without evidence of necrosis. There are
coarse calcifications in the superior aspect of the pancreatic head
corresponding to the lesion described on the prior MRI small
calcifications are also noted in the pancreatic neck. There is no
pancreatic ductal dilatation.

Spleen: Unremarkable.

Adrenals/Urinary Tract: Unremarkable adrenal glands. No evidence of
renal mass, calculi, or hydronephrosis. Unremarkable bladder.

Stomach/Bowel: The stomach is moderately distended by fluid and gas.
There is no evidence of bowel obstruction. Mild left-sided colonic
diverticulosis is noted without definite evidence of acute
diverticulitis. Mild stranding adjacent to the proximal descending
colon is attributed to pancreatitis. The appendix is unremarkable.

Vascular/Lymphatic: Normal caliber of the abdominal aorta. Patent
SMV, splenic vein, and main portal vein. No enlarged lymph nodes.

Reproductive: Unremarkable prostate.

Other: No ascites or pneumoperitoneum. Small fat containing right
inguinal hernia.

Musculoskeletal: No acute osseous abnormality or suspicious osseous
lesion.
IMPRESSION: Acute on chronic pancreatitis without evidence of pancreatic
necrosis or organized fluid collection.

## 2021-01-21 MED ORDER — POTASSIUM CHLORIDE CRYS ER 20 MEQ PO TBCR
40.0000 meq | EXTENDED_RELEASE_TABLET | ORAL | Status: AC
  Administered 2021-01-21 (×2): 40 meq via ORAL
  Filled 2021-01-21 (×2): qty 2

## 2021-01-21 MED ORDER — POTASSIUM CHLORIDE 10 MEQ/100ML IV SOLN
10.0000 meq | INTRAVENOUS | Status: AC
  Administered 2021-01-21 (×2): 10 meq via INTRAVENOUS
  Filled 2021-01-21: qty 100

## 2021-01-21 MED ORDER — LACTATED RINGERS IV SOLN: INTRAVENOUS | Status: DC

## 2021-01-21 MED ORDER — HYDROMORPHONE HCL 1 MG/ML IJ SOLN
1.0000 mg | INTRAMUSCULAR | Status: DC | PRN
  Administered 2021-01-21 (×2): 1 mg via INTRAVENOUS
  Filled 2021-01-21 (×2): qty 1

## 2021-01-21 MED ORDER — MAGNESIUM SULFATE IN D5W 1-5 GM/100ML-% IV SOLN
1.0000 g | Freq: Once | INTRAVENOUS | Status: AC
  Administered 2021-01-21: 1 g via INTRAVENOUS
  Filled 2021-01-21 (×2): qty 100

## 2021-01-21 MED ORDER — ENOXAPARIN SODIUM 40 MG/0.4ML ~~LOC~~ SOLN
40.0000 mg | SUBCUTANEOUS | Status: DC
  Filled 2021-01-21: qty 0.4

## 2021-01-21 MED ORDER — SODIUM CHLORIDE 0.9 % IV SOLN: INTRAVENOUS | Status: DC

## 2021-01-21 MED ORDER — MORPHINE SULFATE (PF) 4 MG/ML IV SOLN
4.0000 mg | Freq: Once | INTRAVENOUS | Status: DC
  Filled 2021-01-21: qty 1

## 2021-01-21 MED ORDER — ONDANSETRON HCL 4 MG/2ML IJ SOLN: 4.0000 mg | Freq: Three times a day (TID) | INTRAMUSCULAR | Status: DC | PRN

## 2021-01-21 MED ORDER — OXYCODONE-ACETAMINOPHEN 5-325 MG PO TABS: 1.0000 | ORAL_TABLET | ORAL | Status: DC | PRN

## 2021-01-21 MED ORDER — IOHEXOL 300 MG/ML  SOLN
100.0000 mL | Freq: Once | INTRAMUSCULAR | Status: AC | PRN
  Administered 2021-01-21: 100 mL via INTRAVENOUS

## 2021-01-21 MED ORDER — ESCITALOPRAM OXALATE 20 MG PO TABS
30.0000 mg | ORAL_TABLET | Freq: Every day | ORAL | Status: DC
  Administered 2021-01-22: 30 mg via ORAL
  Filled 2021-01-21: qty 1

## 2021-01-21 MED ORDER — PANTOPRAZOLE SODIUM 40 MG PO TBEC
40.0000 mg | DELAYED_RELEASE_TABLET | Freq: Every day | ORAL | Status: DC
  Administered 2021-01-22: 40 mg via ORAL
  Filled 2021-01-21: qty 1

## 2021-01-21 MED ORDER — ADULT MULTIVITAMIN W/MINERALS CH
1.0000 | ORAL_TABLET | Freq: Every day | ORAL | Status: DC
  Administered 2021-01-21 – 2021-01-22 (×2): 1 via ORAL
  Filled 2021-01-21 (×2): qty 1

## 2021-01-21 MED ORDER — MORPHINE SULFATE (PF) 4 MG/ML IV SOLN
4.0000 mg | Freq: Once | INTRAVENOUS | Status: AC
  Administered 2021-01-21: 4 mg via INTRAVENOUS
  Filled 2021-01-21: qty 1

## 2021-01-21 MED ORDER — POTASSIUM CHLORIDE 10 MEQ/100ML IV SOLN
10.0000 meq | INTRAVENOUS | Status: DC
  Administered 2021-01-21 (×2): 10 meq via INTRAVENOUS
  Filled 2021-01-21: qty 100

## 2021-01-21 MED ORDER — TRAZODONE HCL 50 MG PO TABS
50.0000 mg | ORAL_TABLET | Freq: Every evening | ORAL | Status: DC | PRN
Start: 2021-01-21 — End: 2021-01-22
  Administered 2021-01-22: 50 mg via ORAL
  Filled 2021-01-21: qty 1

## 2021-01-21 MED ORDER — ACETAMINOPHEN 325 MG PO TABS: 650.0000 mg | ORAL_TABLET | Freq: Four times a day (QID) | ORAL | Status: DC | PRN

## 2021-01-21 MED ORDER — SODIUM CHLORIDE 0.9 % IV BOLUS
1000.0000 mL | Freq: Once | INTRAVENOUS | Status: AC
  Administered 2021-01-21: 1000 mL via INTRAVENOUS

## 2021-01-21 MED ORDER — ONDANSETRON HCL 4 MG/2ML IJ SOLN
4.0000 mg | Freq: Once | INTRAMUSCULAR | Status: AC
  Administered 2021-01-21: 4 mg via INTRAVENOUS
  Filled 2021-01-21: qty 2

## 2021-01-21 NOTE — ED Provider Notes (Signed)
Patient here with acute on chronic pancreatitis.  He is adamant he has not had a drink in 3 years.  This is a second episode since he stopped drinking.  Patient is hemodynamically stable and well-appearing.  He is nontoxic.  CT scan reviewed shows pancreatitis without cyst, abscess, or other complication.  Patient will be started on fluids, antiemetics, and admitted.  No signs of necrosis or hemorrhage.   Shaune Pollack, MD 01/21/21 [phone]

## 2021-01-21 NOTE — H&P (Signed)
History and Physical    Christopher Savage DOB: 12-Jun-1976 DOA: 01/21/2021  Referring MD/NP/PA:   PCP: Gracelyn NurseJohnston, John D, MD   Patient coming from:  The patient is coming from home.  At baseline, pt is independent for most of ADL.        Chief Complaint: abdominal pain  HPI: Christopher KetScott Ask is a 45 y.o. male with medical history significant of alcohol abuse in remission, pancreatitis, depression, who presents with abdominal pain.  Patient states that his abdominal pain started today, which is located in middle abdomen, constant, sharp, 7 out of 10 severity, nonradiating.  Patient has nausea, vomited once with nonbilious nonbloody vomiting.  No diarrhea.  Patient has chills, but no fever.  Denies chest pain, cough, shortness of breath.  No symptoms of UTI.  Patient states that he stopped drinking alcohol 3 years ago.    ED Course: pt was found to have lipase 5838, WBC 12.3, normal liver function, lactic acid 4.3, 2.1, troponin level less than 2, UDS positive for cannabinoid and opiate, negative COVID PCR, echo level less than 10, GFR> 60, potassium 2.9, temperature normal, blood pressure 139/88, heart rate 62, RR 15, oxygen saturation 99% on room air.  CT abdomen/pelvis showed acute on chronic pancreatitis without necrosis or fluid collection.  Patient is placed on MedSurg bed for observation.  Dr. Allegra LaiVanga of GI is consulted.   Review of Systems:   General: no fevers, chills, no body weight gain, has poor appetite HEENT: no blurry vision, hearing changes or sore throat Respiratory: no dyspnea, coughing, wheezing CV: no chest pain, no palpitations GI: has nausea, vomiting, abdominal pain, no diarrhea, constipation GU: no dysuria, burning on urination, increased urinary frequency, hematuria  Ext: no leg edema Neuro: no unilateral weakness, numbness, or tingling, no vision change or hearing loss Skin: no rash, no skin tear. MSK: No muscle spasm, no deformity, no limitation of range  of movement in spin Heme: No easy bruising.  Travel history: No recent long distant travel.  Allergy:  Allergies  Allergen Reactions  . Sulfa Antibiotics Hives    Past Medical History:  Diagnosis Date  . Depression   . Pancreatitis     Past Surgical History:  Procedure Laterality Date  . COLONOSCOPY    . EUS N/A 10/11/2020   Procedure: FULL UPPER ENDOSCOPIC ULTRASOUND (EUS) RADIAL;  Surgeon: Rayann HemanJowell, Paul, MD;  Location: [facility] ENDOSCOPY;  Service: Endoscopy;  Laterality: N/A;  . UPPER GI ENDOSCOPY      Social History:  reports that he has never smoked. He has never used smokeless tobacco. He reports previous alcohol use. He reports previous drug use.  Family History:  Family History  Problem Relation Age of Onset  . Hypertension Mother   . Mental illness Neg Hx      Prior to Admission medications   Medication Sig Start Date End Date Taking? Authorizing Provider  escitalopram (LEXAPRO) 20 MG tablet Take 1.5 tablets (30 mg total) by mouth daily. 03/31/18   Jomarie LongsEappen, Saramma, MD  Multiple Vitamin (MULTIVITAMIN WITH MINERALS) TABS tablet Take 1 tablet by mouth daily. 12/31/17   Enedina FinnerPatel, Sona, MD  traZODone (DESYREL) 50 MG tablet TAKE 1-2 TABLETS (50-100 MG TOTAL) BY MOUTH AT BEDTIME AS NEEDED FOR SLEEP 03/30/18   Jomarie LongsEappen, Saramma, MD    Physical Exam: Vitals:   01/21/21 1318 01/21/21 1319 01/21/21 1330 01/21/21 1400  BP: (!) 141/84  (!) 143/84 139/88  Pulse: 64  65 62  Resp: 20  18 15  Temp: 98 F (36.7 C)     TempSrc: Oral     SpO2: 99%  99% 99%  Weight:  99.8 kg    Height:  6\' 4"  (1.93 m)     General: Not in acute distress HEENT:       Eyes: PERRL, EOMI, no scleral icterus.       ENT: No discharge from the ears and nose, no pharynx injection, no tonsillar enlargement.        Neck: No JVD, no bruit, no mass felt. Heme: No neck lymph node enlargement. Cardiac: S1/S2, RRR, No murmurs, No gallops or rubs. Respiratory: No rales, wheezing, rhonchi or rubs. GI: Soft,  nondistended, has tenderness in middle abdomen, no rebound pain, no organomegaly, BS present. GU: No hematuria Ext: No pitting leg edema bilaterally. 1+DP/PT pulse bilaterally. Musculoskeletal: No joint deformities, No joint redness or warmth, no limitation of ROM in spin. Skin: No rashes.  Neuro: Alert, oriented X3, cranial nerves II-XII grossly intact, moves all extremities normally. Psych: Patient is not psychotic, no suicidal or hemocidal ideation.  Labs on Admission: I have personally reviewed following labs and imaging studies  CBC: Recent Labs  Lab 01/21/21 1312  WBC 12.3*  NEUTROABS 7.9*  HGB 14.4  HCT 40.4  MCV 86.9  PLT 270   Basic Metabolic Panel: Recent Labs  Lab 01/21/21 1312  NA 138  K 2.9*  CL 104  CO2 20*  GLUCOSE 174*  BUN 18  CREATININE 1.28*  CALCIUM 9.3   GFR: Estimated Creatinine Clearance: 88.5 mL/min (A) (by C-G formula based on SCr of 1.28 mg/dL (H)). Liver Function Tests: Recent Labs  Lab 01/21/21 1312  AST 36  ALT 37  ALKPHOS 29*  BILITOT 1.0  PROT 6.8  ALBUMIN 4.5   Recent Labs  Lab 01/21/21 1312  LIPASE 5,838*   No results for input(s): AMMONIA in the last 168 hours. Coagulation Profile: No results for input(s): INR, PROTIME in the last 168 hours. Cardiac Enzymes: No results for input(s): CKTOTAL, CKMB, CKMBINDEX, TROPONINI in the last 168 hours. BNP (last 3 results) No results for input(s): PROBNP in the last 8760 hours. HbA1C: No results for input(s): HGBA1C in the last 72 hours. CBG: No results for input(s): GLUCAP in the last 168 hours. Lipid Profile: No results for input(s): CHOL, HDL, LDLCALC, TRIG, CHOLHDL, LDLDIRECT in the last 72 hours. Thyroid Function Tests: No results for input(s): TSH, T4TOTAL, FREET4, T3FREE, THYROIDAB in the last 72 hours. Anemia Panel: No results for input(s): VITAMINB12, FOLATE, FERRITIN, TIBC, IRON, RETICCTPCT in the last 72 hours. Urine analysis: No results found for: COLORURINE,  APPEARANCEUR, LABSPEC, PHURINE, GLUCOSEU, HGBUR, BILIRUBINUR, KETONESUR, PROTEINUR, UROBILINOGEN, NITRITE, LEUKOCYTESUR Sepsis Labs: @LABRCNTIP (procalcitonin:4,lacticidven:4) ) Recent Results (from the past 240 hour(s))  Resp Panel by RT-PCR (Flu A&B, Covid) Nasopharyngeal Swab     Status: None   Collection Time: 01/21/21  2:14 PM   Specimen: Nasopharyngeal Swab; Nasopharyngeal(NP) swabs in vial transport medium  Result Value Ref Range Status   SARS Coronavirus 2 by RT PCR NEGATIVE NEGATIVE Final    Comment: (NOTE) SARS-CoV-2 target nucleic acids are NOT DETECTED.  The SARS-CoV-2 RNA is generally detectable in upper respiratory specimens during the acute phase of infection. The lowest concentration of SARS-CoV-2 viral copies this assay can detect is 138 copies/mL. A negative result does not preclude SARS-Cov-2 infection and should not be used as the sole basis for treatment or other patient management decisions. A negative result may occur with  improper specimen collection/handling,  submission of specimen other than nasopharyngeal swab, presence of viral mutation(s) within the areas targeted by this assay, and inadequate number of viral copies(<138 copies/mL). A negative result must be combined with clinical observations, patient history, and epidemiological information. The expected result is Negative.  Fact Sheet for Patients:  BloggerCourse.com  Fact Sheet for Healthcare Providers:  SeriousBroker.it  This test is no t yet approved or cleared by the [place] FDA and  has been authorized for detection and/or diagnosis of SARS-CoV-2 by FDA under an Emergency Use Authorization (EUA). This EUA will remain  in effect (meaning this test can be used) for the duration of the COVID-19 declaration under Section 564(b)(1) of the Act, 21 U.S.C.section 360bbb-3(b)(1), unless the authorization is terminated  or revoked sooner.        Influenza A by PCR NEGATIVE NEGATIVE Final   Influenza B by PCR NEGATIVE NEGATIVE Final    Comment: (NOTE) The Xpert Xpress SARS-CoV-2/FLU/RSV plus assay is intended as an aid in the diagnosis of influenza from Nasopharyngeal swab specimens and should not be used as a sole basis for treatment. Nasal washings and aspirates are unacceptable for Xpert Xpress SARS-CoV-2/FLU/RSV testing.  Fact Sheet for Patients: BloggerCourse.com  Fact Sheet for Healthcare Providers: SeriousBroker.it  This test is not yet approved or cleared by the [place] FDA and has been authorized for detection and/or diagnosis of SARS-CoV-2 by FDA under an Emergency Use Authorization (EUA). This EUA will remain in effect (meaning this test can be used) for the duration of the COVID-19 declaration under Section 564(b)(1) of the Act, 21 U.S.C. section 360bbb-3(b)(1), unless the authorization is terminated or revoked.  Performed at [facility], [address], [place], [place] [address]      Radiological Exams on Admission: CT Abdomen Pelvis W Contrast  Result Date: 01/21/2021 CLINICAL DATA:  Epigastric pain.  History of recurrent pancreatitis. EXAM: CT ABDOMEN AND PELVIS WITH CONTRAST TECHNIQUE: Multidetector CT imaging of the abdomen and pelvis was performed using the standard protocol following bolus administration of intravenous contrast. CONTRAST:  OMNIPAQUE IOHEXOL 300 MG/ML  SOLN COMPARISON:  Abdominal MRI 08/10/2020 FINDINGS: Lower chest: Minimal dependent atelectasis in the lung bases. No pleural effusion. Normal heart size. Hepatobiliary: No focal liver abnormality is seen. No gallstones, gallbladder wall thickening, or biliary dilatation. Pancreas: There is extensive inflammatory stranding surrounding the pancreas diffusely with non loculated fluid. The pancreas appears edematous but is enhancing without evidence of necrosis. There are  coarse calcifications in the superior aspect of the pancreatic head corresponding to the lesion described on the prior MRI small calcifications are also noted in the pancreatic neck. There is no pancreatic ductal dilatation. Spleen: Unremarkable. Adrenals/Urinary Tract: Unremarkable adrenal glands. No evidence of renal mass, calculi, or hydronephrosis. Unremarkable bladder. Stomach/Bowel: The stomach is moderately distended by fluid and gas. There is no evidence of bowel obstruction. Mild left-sided colonic diverticulosis is noted without definite evidence of acute diverticulitis. Mild stranding adjacent to the proximal descending colon is attributed to pancreatitis. The appendix is unremarkable. Vascular/Lymphatic: Normal caliber of the abdominal aorta. Patent SMV, splenic vein, and main portal vein. No enlarged lymph nodes. Reproductive: Unremarkable prostate. Other: No ascites or pneumoperitoneum. Small fat containing right inguinal hernia. Musculoskeletal: No acute osseous abnormality or suspicious osseous lesion. IMPRESSION: Acute on chronic pancreatitis without evidence of pancreatic necrosis or organized fluid collection. Electronically Signed   By: Sebastian Ache M.D.   On: 01/21/2021 15:07     EKG:  Not done in ED,  will get one.   Assessment/Plan Principal Problem:   Acute on chronic pancreatitis (HCC) Active Problems:   Depression, prolonged   Lactic acid acidosis   Leukocytosis   Hypokalemia   Acute on chronic pancreatitis [facility]): Lipase of 58 and 38.  CT scan showed acute on chronic pancreatitis, no necrosis or fluid collection.  Etiology is not clear.  Patient quit drinking alcohol 3 years ago. Dr. Allegra Lai of GI is consulted.  -will place in med-surg bed for observation -NPO for pancreatitis -IVF: 1LNS and then at 150 cc/hr -prn IV Dilaudid, Percocet, Tylenol for pain control -prn IV zofran for nausea -Check triglyceride level  Depression, prolonged -Continue home  Lexapro  GERD: -Protonix  Lactic acid acidosis: Lactic acid 4.3, 2.1.  Likely due to dehydration.  No signs of infection -IV fluid as above  Leukocytosis: WBC 12.3.  No signs of infection, likely reactive Follow-up -Follow-up by CBC  Hypokalemia: K= 2.9 on admission. - Repleted - Check Mg and phosphorus level level - Give 1 g of magnesium sulfate   DVT ppx: SQ Lovenox Code Status: Full code Family Communication: not done, no family member is at bed side.    Disposition Plan:  Anticipate discharge back to previous environment Consults called:  Dr. Allegra Lai of GI Admission status and Level of care: Med-Surg:    for obs   Status is: Observation  The patient remains OBS appropriate and will d/c before 2 midnights.  Dispo: The patient is from: Home              Anticipated d/c is to: Home              Patient currently is not medically stable to d/c.   Difficult to place patient No         Date of Service 01/21/2021    Lorretta Harp [facility]   If 7PM-7AM, please contact night-coverage [email or web address] 01/21/2021, 4:51 PM

## 2021-01-21 NOTE — Consult Note (Signed)
Christopher Darby, MD [address]  [place]  [place], [place] [address]  Main: [phone]  Fax: [phone] Pager: [phone]   Consultation  Referring Provider:     No ref. provider found Primary Care Physician:  Baxter Hire, MD Primary Gastroenterologist:  Dr. Alice Reichert         Reason for Consultation:     Acute on chronic pancreatitis  Date of Admission:  01/21/2021 Date of Consultation:  01/21/2021         HPI:   Christopher Savage is a 45 y.o. male known history of moderate chronic pancreatitis is admitted with acute on chronic pancreatitis.  Patient presented with epigastric abdominal pain radiating to his back, as well as generalized abdominal pain.  He has history of alcohol use, quit drinking about 3 years ago.  He had an episode of nausea and nonbloody nonbilious emesis.  No history of fever.  Patient is hemodynamically stable in the ER, labs reveal significant elevated lipase 5838, mild leukocytosis, normal LFTs, mild lactic acidosis.  Patient reports that he had an attack of acute pancreatitis in October 2021.  He completely stopped drinking alcohol use 3 years ago due to recurrent attacks of acute alcoholic pancreatitis.  He never smoked tobacco.  CT abdomen and pelvis revealed extensive inflammatory stranding consistent with acute on chronic pancreatitis, with no evidence of fluid collection or necrosis Patient was last seen by Dr. Alice Reichert in 11/21, IgG4 levels were normal.  He also underwent upper endoscopy ultrasound in 1/22, no evidence of pancreatic lesions identified.  Diagnosed with moderate chronic pancreatitis. He has mild AKI, started on IV fluids, kept n.p.o., does have mild hypokalemia GI is consulted for further management of acute on chronic pancreatitis Patient denies any current or recent alcohol use  NSAIDs: None  Antiplts/Anticoagulants/Anti thrombotics: None  GI Procedures: Upper EUS 1/22 - Normal esophagus. - Normal stomach. - Normal  examined duodenum. EUS: - Endosonographic imaging of the pancreas showed sonographic changes consistent with moderate chronic pancreatitis. The focal lesions seen on MRI not seen on EUS today - however the significant calcifications in the head of the pancreas limited full examination of the pancreas especially the area around the head/uncinate. Although no focal mass seen, the sensitivity of EUS for masses is reduced in the setting of calcific pancreatitis. - There was no sign of significant pathology in the common bile duct and in the common hepatic duct. - No specimens collected.  Past Medical History:  Diagnosis Date  . Depression   . Pancreatitis     Past Surgical History:  Procedure Laterality Date  . COLONOSCOPY    . EUS N/A 10/11/2020   Procedure: FULL UPPER ENDOSCOPIC ULTRASOUND (EUS) RADIAL;  Surgeon: Jola Schmidt, MD;  Location: [facility] ENDOSCOPY;  Service: Endoscopy;  Laterality: N/A;  . UPPER GI ENDOSCOPY      Prior to Admission medications   Medication Sig Start Date End Date Taking? Authorizing Provider  escitalopram (LEXAPRO) 20 MG tablet Take 1.5 tablets (30 mg total) by mouth daily. 03/31/18  Yes Ursula Alert, MD  Multiple Vitamin (MULTIVITAMIN WITH MINERALS) TABS tablet Take 1 tablet by mouth daily. 12/31/17  Yes Fritzi Mandes, MD  omeprazole (PRILOSEC) 20 MG capsule Take 20 mg by mouth daily.   Yes [provider]  traZODone (DESYREL) 50 MG tablet TAKE 1-2 TABLETS (50-100 MG TOTAL) BY MOUTH AT BEDTIME AS NEEDED FOR SLEEP 03/30/18  Yes Ursula Alert, MD   Current Facility-Administered Medications:  .  acetaminophen (TYLENOL) tablet 650 mg, 650 mg, Oral, Q6H PRN, Ivor Costa, MD .  enoxaparin (LOVENOX) injection 40 mg, 40 mg, Subcutaneous, Q24H, Ivor Costa, MD .  Derrill Memo ON 01/22/2021] escitalopram (LEXAPRO) tablet 30 mg, 30 mg, Oral, Daily, Ivor Costa, MD .  HYDROmorphone (DILAUDID) injection 1 mg, 1 mg, Intravenous, Q3H PRN, Ivor Costa, MD, 1 mg at 01/21/21  1809 .  lactated ringers infusion, , Intravenous, Continuous, Kamon Fahr, Tally Due, MD .  magnesium sulfate IVPB 1 g 100 mL, 1 g, Intravenous, Once, Ivor Costa, MD .  morphine 4 MG/ML injection 4 mg, 4 mg, Intravenous, Once, Ivor Costa, MD .  multivitamin with minerals tablet 1 tablet, 1 tablet, Oral, Daily, Ivor Costa, MD .  ondansetron (ZOFRAN) injection 4 mg, 4 mg, Intravenous, Q8H PRN, Ivor Costa, MD .  oxyCODONE-acetaminophen (PERCOCET/ROXICET) 5-325 MG per tablet 1 tablet, 1 tablet, Oral, Q4H PRN, Ivor Costa, MD .  Derrill Memo ON 01/22/2021] pantoprazole (PROTONIX) EC tablet 40 mg, 40 mg, Oral, Daily, Ivor Costa, MD .  potassium chloride SA (KLOR-CON) CR tablet 40 mEq, 40 mEq, Oral, Q4H, Ivor Costa, MD, 40 mEq at 01/21/21 1618 .  traZODone (DESYREL) tablet 50-100 mg, 50-100 mg, Oral, QHS PRN, Ivor Costa, MD   Family History  Problem Relation Age of Onset  . Hypertension Mother   . Mental illness Neg Hx      Social History   Tobacco Use  . Smoking status: Never Smoker  . Smokeless tobacco: Never Used  Vaping Use  . Vaping Use: Never used  Substance Use Topics  . Alcohol use: Not Currently    Alcohol/week: 0.0 standard drinks    Comment: last drinked as of april 1st  . Drug use: Not Currently    Allergies as of 01/21/2021 - Review Complete 01/21/2021  Allergen Reaction Noted  . Sulfa antibiotics Hives 12/28/2017    Review of Systems:    All systems reviewed and negative except where noted in HPI.   Physical Exam:  Vital signs in last 24 hours: Temp:  [98 F (36.7 C)-99 F (37.2 C)] 99 F (37.2 C) (04/25 1849) Pulse Rate:  [62-65] 63 (04/25 1849) Resp:  [15-20] 20 (04/25 1849) BP: (126-143)/(84-90) 126/90 (04/25 1849) SpO2:  [99 %] 99 % (04/25 1849) Weight:  [99.8 kg] 99.8 kg (04/25 1319)   General:   Pleasant, cooperative in NAD Head:  Normocephalic and atraumatic. Eyes:   No icterus.   Conjunctiva pink. PERRLA. Ears:  Normal auditory acuity. Neck:  Supple; no  masses or thyroidomegaly Lungs: Respirations even and unlabored. Lungs clear to auscultation bilaterally.   No wheezes, crackles, or rhonchi.  Heart:  Regular rate and rhythm;  Without murmur, clicks, rubs or gallops Abdomen:  Soft, moderately distended, epigastric tenderness.  Hypoactive bowel sounds. No appreciable masses or hepatomegaly.  No rebound or guarding.  Rectal:  Not performed. Msk:  Symmetrical without gross deformities.  Strength normal Extremities:  Without edema, cyanosis or clubbing. Neurologic:  Alert and oriented x3;  grossly normal neurologically. Skin:  Intact without significant lesions or rashes. Psych:  Alert and cooperative. Normal affect.  LAB RESULTS: CBC Latest Ref Rng & Units 01/21/2021 12/29/2017 12/28/2017  WBC 4.0 - 10.5 K/uL 12.3(H) 9.7 15.2(H)  Hemoglobin 13.0 - 17.0 g/dL 14.4 15.2 15.8  Hematocrit 39.0 - 52.0 % 40.4 45.2 47.0  Platelets 150 - 400 K/uL 270 258 343    BMET BMP Latest Ref Rng & Units 01/21/2021 12/29/2017 12/28/2017  Glucose 70 - 99 mg/dL 174(H)  131(H) 184(H)  BUN 6 - 20 mg/dL 18 18 25(H)  Creatinine 0.61 - 1.24 mg/dL 1.28(H) 1.15 1.26(H)  Sodium 135 - 145 mmol/L 138 138 138  Potassium 3.5 - 5.1 mmol/L 2.9(L) 4.0 3.7  Chloride 98 - 111 mmol/L 104 104 104  CO2 22 - 32 mmol/L 20(L) 26 23  Calcium 8.9 - 10.3 mg/dL 9.3 8.8(L) 9.6    LFT Hepatic Function Latest Ref Rng & Units 01/21/2021 12/29/2017 12/28/2017  Total Protein 6.5 - 8.1 g/dL 6.8 6.8 7.4  Albumin 3.5 - 5.0 g/dL 4.5 4.0 4.7  AST 15 - 41 U/L 36 24 32  ALT 0 - 44 U/L 37 17 20  Alk Phosphatase 38 - 126 U/L 29(L) 29(L) 36(L)  Total Bilirubin 0.3 - 1.2 mg/dL 1.0 1.0 0.9     STUDIES: CT Abdomen Pelvis W Contrast  Result Date: 01/21/2021 CLINICAL DATA:  Epigastric pain.  History of recurrent pancreatitis. EXAM: CT ABDOMEN AND PELVIS WITH CONTRAST TECHNIQUE: Multidetector CT imaging of the abdomen and pelvis was performed using the standard protocol following bolus administration of  intravenous contrast. CONTRAST:  134m OMNIPAQUE IOHEXOL 300 MG/ML  SOLN COMPARISON:  Abdominal MRI 08/10/2020 FINDINGS: Lower chest: Minimal dependent atelectasis in the lung bases. No pleural effusion. Normal heart size. Hepatobiliary: No focal liver abnormality is seen. No gallstones, gallbladder wall thickening, or biliary dilatation. Pancreas: There is extensive inflammatory stranding surrounding the pancreas diffusely with non loculated fluid. The pancreas appears edematous but is enhancing without evidence of necrosis. There are coarse calcifications in the superior aspect of the pancreatic head corresponding to the lesion described on the prior MRI small calcifications are also noted in the pancreatic neck. There is no pancreatic ductal dilatation. Spleen: Unremarkable. Adrenals/Urinary Tract: Unremarkable adrenal glands. No evidence of renal mass, calculi, or hydronephrosis. Unremarkable bladder. Stomach/Bowel: The stomach is moderately distended by fluid and gas. There is no evidence of bowel obstruction. Mild left-sided colonic diverticulosis is noted without definite evidence of acute diverticulitis. Mild stranding adjacent to the proximal descending colon is attributed to pancreatitis. The appendix is unremarkable. Vascular/Lymphatic: Normal caliber of the abdominal aorta. Patent SMV, splenic vein, and main portal vein. No enlarged lymph nodes. Reproductive: Unremarkable prostate. Other: No ascites or pneumoperitoneum. Small fat containing right inguinal hernia. Musculoskeletal: No acute osseous abnormality or suspicious osseous lesion. IMPRESSION: Acute on chronic pancreatitis without evidence of pancreatic necrosis or organized fluid collection. Electronically Signed   By: ALogan BoresM.D.   On: 01/21/2021 15:07      Impression / Plan:   Christopher Savage a 45y.o. male with history of chronic pancreatitis, likely secondary to alcohol use, history of recurrent acute on chronic pancreatitis is  admitted with acute on chronic pancreatitis with mild AKI  Acute on chronic pancreatitis with no evidence of necrosis or fluid collection, with mild AKI No history of tobacco use History of alcohol use, quit drinking 3 years ago IgG4 levels were normal as outpatient in 11/21 Agree with checking serum triglyceride levels Unfortunately, patients with chronic pancreatitis are at risk for recurrent attacks of acute pancreatitis and can also develop pancreatic insufficiency leading to fat malabsorption.  Sometimes, diet, stress can be the trigger for flareups Agree with conservative management with aggressive fluid resuscitation Bowel rest, pain control Low-fat diet long-term Check pancreatic fecal elastase levels Recommend to start pancreatic enzyme replacement such as Creon or Zenpep  Close follow-up with GI as outpatient, advised patient to see pancreas specialist for long-term management of chronic pancreatitis  Thank you for involving me in the care of this patient.      LOS: 0 days   Sherri Sear, MD  01/21/2021, 7:44 PM   Note: This dictation was prepared with Dragon dictation along with smaller phrase technology. Any transcriptional errors that result from this process are unintentional.

## 2021-01-21 NOTE — ED Provider Notes (Signed)
[facility] Emergency Department Provider Note   ____________________________________________   Event Date/Time   First MD Initiated Contact with Patient 01/21/21 1302     (approximate)  I have reviewed the triage vital signs and the nursing notes.   HISTORY  Chief Complaint Abdominal Pain    HPI Christopher Savage is a 45 y.o. male with the below stated past medical history presents for midepigastric abdominal pain via EMS started approximately 2 hours prior to arrival and is associated with nausea.  Patient states this pain radiates through into his back and is causing generalized abdominal pain as well.  Denies any exacerbating or relieving factors.  Patient states that he does not know the cause of his recurrent pancreatitis and is currently being worked up as an outpatient.  Patient denies drinking every day or history of gallbladder disease.  Patient currently denies any vision changes, tinnitus, difficulty speaking, facial droop, sore throat, chest pain, shortness of breath, diarrhea, dysuria, or weakness/numbness/paresthesias in any extremity         Past Medical History:  Diagnosis Date  . Depression   . Pancreatitis     Patient Active Problem List   Diagnosis Date Noted  . Acute on chronic pancreatitis (HCC) 01/21/2021  . Lactic acid acidosis 01/21/2021  . Leukocytosis 01/21/2021  . Hypokalemia 01/21/2021  . History of pancreatitis 01/11/2018  . Depression, prolonged 01/11/2018  . Alcohol abuse 01/11/2018  . Acute pancreatitis 12/28/2017    Past Surgical History:  Procedure Laterality Date  . COLONOSCOPY    . EUS N/A 10/11/2020   Procedure: FULL UPPER ENDOSCOPIC ULTRASOUND (EUS) RADIAL;  Surgeon: Rayann Heman, MD;  Location: [facility] ENDOSCOPY;  Service: Endoscopy;  Laterality: N/A;  . UPPER GI ENDOSCOPY      Prior to Admission medications   Medication Sig Start Date End Date Taking? Authorizing Provider  escitalopram (LEXAPRO) 20 MG  tablet Take 1.5 tablets (30 mg total) by mouth daily. 03/31/18  Yes Jomarie Longs, MD  Multiple Vitamin (MULTIVITAMIN WITH MINERALS) TABS tablet Take 1 tablet by mouth daily. 12/31/17  Yes Enedina Finner, MD  omeprazole (PRILOSEC) 20 MG capsule Take 20 mg by mouth daily.   Yes [provider]  traZODone (DESYREL) 50 MG tablet TAKE 1-2 TABLETS (50-100 MG TOTAL) BY MOUTH AT BEDTIME AS NEEDED FOR SLEEP 03/30/18  Yes Eappen, Levin Bacon, MD  lipase/protease/amylase (CREON) 36000 UNITS CPEP capsule Take 1 capsule (36,000 Units total) by mouth 3 (three) times daily before meals. 01/22/21 02/21/21  Azucena Fallen, MD    Allergies Sulfa antibiotics  Family History  Problem Relation Age of Onset  . Hypertension Mother   . Mental illness Neg Hx     Social History Social History   Tobacco Use  . Smoking status: Never Smoker  . Smokeless tobacco: Never Used  Vaping Use  . Vaping Use: Never used  Substance Use Topics  . Alcohol use: Not Currently    Alcohol/week: 0.0 standard drinks    Comment: last drinked as of april 1st  . Drug use: Not Currently    Review of Systems Constitutional: No fever/chills Eyes: No visual changes. ENT: No sore throat. Cardiovascular: Denies chest pain. Respiratory: Denies shortness of breath. Gastrointestinal: Endorses abdominal pain and nausea, no vomiting.  No diarrhea. Genitourinary: Negative for dysuria. Musculoskeletal: Negative for acute arthralgias Skin: Negative for rash. Neurological: Negative for headaches, weakness/numbness/paresthesias in any extremity Psychiatric: Negative for suicidal ideation/homicidal ideation   ____________________________________________   PHYSICAL EXAM:  VITAL SIGNS: ED  Triage Vitals  Enc Vitals Group     BP 01/21/21 1318 (!) 141/84     Pulse Rate 01/21/21 1318 64     Resp 01/21/21 1318 20     Temp 01/21/21 1318 98 F (36.7 C)     Temp Source 01/21/21 1318 Oral     SpO2 01/21/21 1318 99 %     Weight  01/21/21 1319 220 lb (99.8 kg)     Height 01/21/21 1319 6\' 4"  (1.93 m)     Head Circumference --      Peak Flow --      Pain Score 01/21/21 1319 8     Pain Loc --      Pain Edu? --      Excl. in GC? --    Constitutional: Alert and oriented. Well appearing and in moderate acute distress secondary to pain Eyes: Conjunctivae are normal. PERRL. Head: Atraumatic. Nose: No congestion/rhinnorhea. Mouth/Throat: Mucous membranes are moist. Neck: No stridor Cardiovascular: Grossly normal heart sounds.  Good peripheral circulation. Respiratory: Normal respiratory effort.  No retractions. Gastrointestinal: Soft and epigastric tenderness to palpation. No distention. Musculoskeletal: No obvious deformities Neurologic:  Normal speech and language. No gross focal neurologic deficits are appreciated. Skin:  Skin is warm and dry. No rash noted. Psychiatric: Mood and affect are normal. Speech and behavior are normal.  ____________________________________________   LABS (all labs ordered are listed, but only abnormal results are displayed)  Labs Reviewed  COMPREHENSIVE METABOLIC PANEL - Abnormal; Notable for the following components:      Result Value   Potassium 2.9 (*)    CO2 20 (*)    Glucose, Bld 174 (*)    Creatinine, Ser 1.28 (*)    Alkaline Phosphatase 29 (*)    All other components within normal limits  LIPASE, BLOOD - Abnormal; Notable for the following components:   Lipase 5,838 (*)    All other components within normal limits  CBC WITH DIFFERENTIAL/PLATELET - Abnormal; Notable for the following components:   WBC 12.3 (*)    Neutro Abs 7.9 (*)    All other components within normal limits  LACTIC ACID, PLASMA - Abnormal; Notable for the following components:   Lactic Acid, Venous 4.3 (*)    All other components within normal limits  LACTIC ACID, PLASMA - Abnormal; Notable for the following components:   Lactic Acid, Venous 2.1 (*)    All other components within normal limits   URINE DRUG SCREEN, QUALITATIVE ([facility] ONLY) - Abnormal; Notable for the following components:   Opiate, Ur Screen POSITIVE (*)    Cannabinoid 50 Ng, Ur Powell POSITIVE (*)    All other components within normal limits  PHOSPHORUS - Abnormal; Notable for the following components:   Phosphorus 2.2 (*)    All other components within normal limits  LIPASE, BLOOD - Abnormal; Notable for the following components:   Lipase 548 (*)    All other components within normal limits  COMPREHENSIVE METABOLIC PANEL - Abnormal; Notable for the following components:   Glucose, Bld 117 (*)    Total Protein 6.3 (*)    Alkaline Phosphatase 24 (*)    All other components within normal limits  RESP PANEL BY RT-PCR (FLU A&B, COVID) ARPGX2  ETHANOL  TRIGLYCERIDES  MAGNESIUM  MAGNESIUM  HIV ANTIBODY (ROUTINE TESTING W REFLEX)  CBC  TROPONIN I (HIGH SENSITIVITY)  TROPONIN I (HIGH SENSITIVITY)   RADIOLOGY  ED MD interpretation:  CT of the abdomen and pelvis with IV contrast  shows acute on chronic pancreatitis  Official radiology report(s): No results found.  ____________________________________________   PROCEDURES  Procedure(s) performed (including Critical Care):  Procedures   ____________________________________________   INITIAL IMPRESSION / ASSESSMENT AND PLAN / ED COURSE  As part of my medical decision making, I reviewed the following data within the electronic MEDICAL RECORD NUMBER Nursing notes reviewed and incorporated, Labs reviewed, EKG interpreted, Old chart reviewed, Radiograph reviewed and Notes from prior ED visits reviewed and incorporated        Patient presents with midepigastric abdominal pain with associated nausea/vomiting. Given history and exam I have a low suspicion for AAA, SBO, appendicitis, mesenteric ischemia, nephrolithiasis, pyelonephritis, or diverticulitis. I have a moderate concern for pancreatitis, gastritis vs non-bleeding peptic ulcer, or hepatobiliary disease and  thus will obtain labs and imaging.  ED Workup: CBC, BMP, LFTs, Lipase  No signs of severe pancreatitis on exam such as ecchymosis of periumbilical region or flanks, hypoxia, or tachypneia.  Disposition: Admit for bowel rest, continued analgesia, monitoring.      ____________________________________________   FINAL CLINICAL IMPRESSION(S) / ED DIAGNOSES  Final diagnoses:  Acute on chronic pancreatitis (HCC)  Epigastric pain  Non-intractable vomiting with nausea, unspecified vomiting type     ED Discharge Orders         Ordered    lipase/protease/amylase (CREON) 36000 UNITS CPEP capsule  3 times daily before meals        01/22/21 1437    Increase activity slowly        01/22/21 1437    Diet full liquid        01/22/21 1437    Call MD for:  severe uncontrolled pain        01/22/21 1437    Call MD for:  persistant nausea and vomiting        01/22/21 1437           Note:  This document was prepared using Dragon voice recognition software and may include unintentional dictation errors.   Merwyn Katos, MD 01/27/21 [phone]

## 2021-01-21 NOTE — ED Notes (Signed)
Informed RN bed assigned 

## 2021-01-21 NOTE — ED Triage Notes (Signed)
Pt presents to the [facility] via EMS from home with c/o diffuse abd pain that began today. Pt reports history of recurrent pancreatitis without known cause. Pt currently A&Ox4 with stable vital signs.

## 2021-01-22 ENCOUNTER — Encounter: Payer: Self-pay | Admitting: Internal Medicine

## 2021-01-22 DIAGNOSIS — K859 Acute pancreatitis without necrosis or infection, unspecified: Secondary | ICD-10-CM | POA: Diagnosis not present

## 2021-01-22 DIAGNOSIS — K861 Other chronic pancreatitis: Secondary | ICD-10-CM | POA: Diagnosis not present

## 2021-01-22 LAB — CBC
HCT: 39.1 % (ref 39.0–52.0)
Hemoglobin: 13.6 g/dL (ref 13.0–17.0)
MCH: 30.8 pg (ref 26.0–34.0)
MCHC: 34.8 g/dL (ref 30.0–36.0)
MCV: 88.7 fL (ref 80.0–100.0)
Platelets: 206 10*3/uL (ref 150–400)
RBC: 4.41 MIL/uL (ref 4.22–5.81)
RDW: 12.9 % (ref 11.5–15.5)
WBC: 10.1 10*3/uL (ref 4.0–10.5)
nRBC: 0 % (ref 0.0–0.2)

## 2021-01-22 LAB — COMPREHENSIVE METABOLIC PANEL
ALT: 29 U/L (ref 0–44)
AST: 20 U/L (ref 15–41)
Albumin: 4 g/dL (ref 3.5–5.0)
Alkaline Phosphatase: 24 U/L — ABNORMAL LOW (ref 38–126)
Anion gap: 8 (ref 5–15)
BUN: 12 mg/dL (ref 6–20)
CO2: 26 mmol/L (ref 22–32)
Calcium: 9 mg/dL (ref 8.9–10.3)
Chloride: 104 mmol/L (ref 98–111)
Creatinine, Ser: 0.98 mg/dL (ref 0.61–1.24)
GFR, Estimated: >60 mL/min (ref >60)
Glucose, Bld: 117 mg/dL — ABNORMAL HIGH (ref 70–99)
Potassium: 4.1 mmol/L (ref 3.5–5.1)
Sodium: 138 mmol/L (ref 135–145)
Total Bilirubin: 1.1 mg/dL (ref 0.3–1.2)
Total Protein: 6.3 g/dL — ABNORMAL LOW (ref 6.5–8.1)

## 2021-01-22 LAB — MAGNESIUM: Magnesium: 2.3 mg/dL (ref 1.7–2.4)

## 2021-01-22 LAB — LIPASE, BLOOD: Lipase: 548 U/L — ABNORMAL HIGH (ref 11–51)

## 2021-01-22 LAB — HIV ANTIBODY (ROUTINE TESTING W REFLEX): HIV Screen 4th Generation wRfx: NONREACTIVE

## 2021-01-22 MED ORDER — PANCRELIPASE (LIP-PROT-AMYL) 36000-114000 UNITS PO CPEP: 36000.0000 [IU] | ORAL_CAPSULE | Freq: Three times a day (TID) | ORAL | 0 refills | Status: AC

## 2021-01-22 MED ORDER — PANCRELIPASE (LIP-PROT-AMYL) 36000-114000 UNITS PO CPEP
36000.0000 [IU] | ORAL_CAPSULE | Freq: Three times a day (TID) | ORAL | Status: DC
  Administered 2021-01-22: 36000 [IU] via ORAL
  Filled 2021-01-22 (×3): qty 1

## 2021-01-22 NOTE — Discharge Instructions (Signed)
Acute Pancreatitis  Acute pancreatitis happens when the pancreas gets swollen. The pancreas is a large gland in the body that helps to control blood sugar. It also makes enzymes that help to digest food. This condition can last a few days and cause serious problems. The lungs, heart, and kidneys may stop working. What are the causes? Causes include:  Alcohol abuse.  Drug abuse.  Gallstones.  A tumor in the pancreas. Other causes include:  Some medicines.  Some chemicals.  Diabetes.  An infection.  Damage caused by an accident.  The poison (venom) from a scorpion bite.  Belly (abdominal) surgery.  The body's defense system (immune system) attacking the pancreas (autoimmune pancreatitis).  Genes that are passed from parent to child (inherited). In some cases, the cause is not known. What are the signs or symptoms?  Pain in the upper belly that may be felt in the back. The pain may be very bad.  Swelling of the belly.  Feeling sick to your stomach (nauseous) and throwing up (vomiting).  Fever. How is this treated? You will likely have to stay in the hospital. Treatment may include:  Pain medicine.  Fluid through an IV tube.  Placing a tube in the stomach to take out the stomach contents. This may help you stop throwing up.  Not eating for 3-4 days.  Antibiotic medicines, if you have an infection.  Treating any other problems that may be the cause.  Steroid medicines, if your problem is caused by your defense system attacking your body's own tissues.  Surgery. Follow these instructions at home: Eating and drinking  Follow instructions from your doctor about what to eat and drink.  Eat foods that do not have a lot of fat in them.  Eat small meals often. Do not eat big meals.  Drink enough fluid to keep your pee (urine) pale yellow.  Do not drink alcohol if it caused your condition.   Medicines  Take over-the-counter and prescription medicines only  as told by your doctor.  Ask your doctor if the medicine prescribed to you: ? Requires you to avoid driving or using heavy machinery. ? Can cause trouble pooping (constipation). You may need to take steps to prevent or treat trouble pooping:  Take over-the-counter or prescription medicines.  Eat foods that are high in fiber. These include beans, whole grains, and fresh fruits and vegetables.  Limit foods that are high in fat and sugar. These include fried or sweet foods. General instructions  Do not use any products that contain nicotine or tobacco, such as cigarettes, e-cigarettes, and chewing tobacco. If you need help quitting, ask your doctor.  Get plenty of rest.  Check your blood sugar at home as told by your doctor.  Keep all follow-up visits as told by your doctor. This is important. Contact a doctor if:  You do not get better as quickly as expected.  You have new symptoms.  Your symptoms get worse.  You have pain or weakness that lasts a long time.  You keep feeling sick to your stomach.  You get better and then you have pain again.  You have a fever. Get help right away if:  You cannot eat or keep fluids down.  Your pain gets very bad.  Your skin or the white part of your eyes turns yellow.  You have sudden swelling in your belly.  You throw up.  You feel dizzy or you pass out (faint).  Your blood sugar is high (over   300 mg/dL). Summary  Acute pancreatitis happens when the pancreas gets swollen.  This condition is often caused by alcohol abuse, drug abuse, or gallstones.  You will likely have to stay in the hospital for treatment. This information is not intended to replace advice given to you by your health care provider. Make sure you discuss any questions you have with your health care provider. Document Revised: 07/05/2018 Document Reviewed: 07/05/2018 Elsevier Patient Education  2021 [employer]  

## 2021-01-22 NOTE — Plan of Care (Signed)
Ready for discharge per patient

## 2021-01-22 NOTE — Discharge Summary (Signed)
Physician Discharge Summary  Christopher Savage PZW:[id number] DOB: 1976/07/30 DOA: 01/21/2021  PCP: Gracelyn Nurse, MD  Admit date: 01/21/2021 Discharge date: 01/22/2021  Admitted From: Home Disposition: Home  Recommendations for Outpatient Follow-up:  1. Follow up with PCP in 1-2 weeks 2. Please obtain BMP/CBC in one week 3. Please follow up with GI as scheduled  Home Health: None Equipment/Devices: None  Discharge Condition: Stable CODE STATUS: Full Diet recommendation: Full liquid diet for 7 to 10 days, slowly advance back to diet as discussed with GI  Brief/Interim Summary: Christopher Claypoolis a 44 y.o.malewith medical history significant ofalcohol abuse in remission, history of recurrent pancreatitis, depression, who presents withabdominal pain. Patient states that his abdominal pain started today, which is located inmiddleabdomen, constant, sharp, 7 out of 10 severity, nonradiating. Patient has nausea, vomited once with nonbilious nonbloody vomiting. No diarrhea. Patient has chills, but no fever. Denies chest pain, cough, shortness of breath. No symptoms of UTI. Patient states that he stopped drinking alcohol 3 years ago. In ED: pt was found to havelipase 5838, WBC 12.3, normal liver function, lactic acid 4.3, 2.1, troponin level less than 2, UDS positive for cannabinoid and opiate, negative COVID PCR, echo level less than 10, GFR>60, potassium 2.9, temperature normal, blood pressure 139/88, heart rate 62, RR 15, oxygen saturation 99% on room air. CT abdomen/pelvis showed acute on chronic pancreatitis without necrosis or fluid collection. Patient is placed on MedSurg bed for observation. Dr. Allegra Lai of GI is consulted.  Patient admitted as above with acute recurrent pancreatitis, he has had multiple bouts of pancreatitis over the past few years, nearly annually last event was October 2021 only 6 months ago.  Further discussion with GI, patient continues to remain abstinent from  alcohol use, we discussed that this may be dietary given his profound history of recurrent pancreatitis he may be sensitive even to dietary changes.  GI has made recommendations for diet, at this time patient's pain is well controlled tolerating p.o quite well this morning, will discharge on full liquid diet for the next week to 10 days and slowly advance back to low-fat diet as recommended by GI.  Discharge Diagnoses:  Principal Problem:   Acute on chronic pancreatitis [facility]) Active Problems:   Depression, prolonged   Lactic acid acidosis   Leukocytosis   Hypokalemia    Discharge Instructions  Discharge Instructions    Call MD for:  persistant nausea and vomiting   Complete by: As directed    Call MD for:  severe uncontrolled pain   Complete by: As directed    Diet full liquid   Complete by: As directed    Increase activity slowly   Complete by: As directed      Allergies as of 01/22/2021      Reactions   Sulfa Antibiotics Hives      Medication List    TAKE these medications   escitalopram 20 MG tablet Commonly known as: LEXAPRO Take 1.5 tablets (30 mg total) by mouth daily.   lipase/protease/amylase 42353 UNITS Cpep capsule Commonly known as: CREON Take 1 capsule (36,000 Units total) by mouth 3 (three) times daily before meals.   multivitamin with minerals Tabs tablet Take 1 tablet by mouth daily.   omeprazole 20 MG capsule Commonly known as: PRILOSEC Take 20 mg by mouth daily.   traZODone 50 MG tablet Commonly known as: DESYREL TAKE 1-2 TABLETS (50-100 MG TOTAL) BY MOUTH AT BEDTIME AS NEEDED FOR SLEEP       Allergies  Allergen Reactions  . Sulfa Antibiotics Hives    Consultations:  GI   Procedures/Studies: CT Abdomen Pelvis W Contrast  Result Date: 01/21/2021 CLINICAL DATA:  Epigastric pain.  History of recurrent pancreatitis. EXAM: CT ABDOMEN AND PELVIS WITH CONTRAST TECHNIQUE: Multidetector CT imaging of the abdomen and pelvis was performed  using the standard protocol following bolus administration of intravenous contrast. CONTRAST:  OMNIPAQUE IOHEXOL 300 MG/ML  SOLN COMPARISON:  Abdominal MRI 08/10/2020 FINDINGS: Lower chest: Minimal dependent atelectasis in the lung bases. No pleural effusion. Normal heart size. Hepatobiliary: No focal liver abnormality is seen. No gallstones, gallbladder wall thickening, or biliary dilatation. Pancreas: There is extensive inflammatory stranding surrounding the pancreas diffusely with non loculated fluid. The pancreas appears edematous but is enhancing without evidence of necrosis. There are coarse calcifications in the superior aspect of the pancreatic head corresponding to the lesion described on the prior MRI small calcifications are also noted in the pancreatic neck. There is no pancreatic ductal dilatation. Spleen: Unremarkable. Adrenals/Urinary Tract: Unremarkable adrenal glands. No evidence of renal mass, calculi, or hydronephrosis. Unremarkable bladder. Stomach/Bowel: The stomach is moderately distended by fluid and gas. There is no evidence of bowel obstruction. Mild left-sided colonic diverticulosis is noted without definite evidence of acute diverticulitis. Mild stranding adjacent to the proximal descending colon is attributed to pancreatitis. The appendix is unremarkable. Vascular/Lymphatic: Normal caliber of the abdominal aorta. Patent SMV, splenic vein, and main portal vein. No enlarged lymph nodes. Reproductive: Unremarkable prostate. Other: No ascites or pneumoperitoneum. Small fat containing right inguinal hernia. Musculoskeletal: No acute osseous abnormality or suspicious osseous lesion. IMPRESSION: Acute on chronic pancreatitis without evidence of pancreatic necrosis or organized fluid collection. Electronically Signed   By: Sebastian Ache M.D.   On: 01/21/2021 15:07     Subjective: No acute issues or events overnight, tolerating p.o. quite well   Discharge Exam: Vitals:   01/22/21  0814 01/22/21 1154  BP: 113/74 129/80  Pulse: (!) 52 63  Resp: 15 16  Temp: 98.7 F (37.1 C) 98.9 F (37.2 C)  SpO2: 97% 95%   Vitals:   01/21/21 1949 01/22/21 0453 01/22/21 0814 01/22/21 1154  BP: 130/90 (!) 140/91 113/74 129/80  Pulse: 62 (!) 57 (!) 52 63  Resp: 17 16 15 16   Temp: 99.1 F (37.3 C) (!) 97.2 F (36.2 C) 98.7 F (37.1 C) 98.9 F (37.2 C)  TempSrc: Oral  Oral   SpO2: 100% 99% 97% 95%  Weight:      Height:        General: Pt is alert, awake, not in acute distress Cardiovascular: RRR, S1/S2 +, no rubs, no gallops Respiratory: CTA bilaterally, no wheezing, no rhonchi Abdominal: Soft, NT, ND, bowel sounds + Extremities: no edema, no cyanosis    The results of significant diagnostics from this hospitalization (including imaging, microbiology, ancillary and laboratory) are listed below for reference.     Microbiology: Recent Results (from the past 240 hour(s))  Resp Panel by RT-PCR (Flu A&B, Covid) Nasopharyngeal Swab     Status: None   Collection Time: 01/21/21  2:14 PM   Specimen: Nasopharyngeal Swab; Nasopharyngeal(NP) swabs in vial transport medium  Result Value Ref Range Status   SARS Coronavirus 2 by RT PCR NEGATIVE NEGATIVE Final    Comment: (NOTE) SARS-CoV-2 target nucleic acids are NOT DETECTED.  The SARS-CoV-2 RNA is generally detectable in upper respiratory specimens during the acute phase of infection. The lowest concentration of SARS-CoV-2 viral copies this assay can detect is 138  copies/mL. A negative result does not preclude SARS-Cov-2 infection and should not be used as the sole basis for treatment or other patient management decisions. A negative result may occur with  improper specimen collection/handling, submission of specimen other than nasopharyngeal swab, presence of viral mutation(s) within the areas targeted by this assay, and inadequate number of viral copies(<138 copies/mL). A negative result must be combined with clinical  observations, patient history, and epidemiological information. The expected result is Negative.  Fact Sheet for Patients:  BloggerCourse.com  Fact Sheet for Healthcare Providers:  SeriousBroker.it  This test is no t yet approved or cleared by the [place] FDA and  has been authorized for detection and/or diagnosis of SARS-CoV-2 by FDA under an Emergency Use Authorization (EUA). This EUA will remain  in effect (meaning this test can be used) for the duration of the COVID-19 declaration under Section 564(b)(1) of the Act, 21 U.S.C.section 360bbb-3(b)(1), unless the authorization is terminated  or revoked sooner.       Influenza A by PCR NEGATIVE NEGATIVE Final   Influenza B by PCR NEGATIVE NEGATIVE Final    Comment: (NOTE) The Xpert Xpress SARS-CoV-2/FLU/RSV plus assay is intended as an aid in the diagnosis of influenza from Nasopharyngeal swab specimens and should not be used as a sole basis for treatment. Nasal washings and aspirates are unacceptable for Xpert Xpress SARS-CoV-2/FLU/RSV testing.  Fact Sheet for Patients: BloggerCourse.com  Fact Sheet for Healthcare Providers: SeriousBroker.it  This test is not yet approved or cleared by the [place] FDA and has been authorized for detection and/or diagnosis of SARS-CoV-2 by FDA under an Emergency Use Authorization (EUA). This EUA will remain in effect (meaning this test can be used) for the duration of the COVID-19 declaration under Section 564(b)(1) of the Act, 21 U.S.C. section 360bbb-3(b)(1), unless the authorization is terminated or revoked.  Performed at [facility], [address] [place], [place] [address]      Labs: BNP (last 3 results) No results for input(s): BNP in the last 8760 hours. Basic Metabolic Panel: Recent Labs  Lab 01/21/21 1312 01/21/21 1513 01/22/21 0500  NA 138  --  138   K 2.9*  --  4.1  CL 104  --  104  CO2 20*  --  26  GLUCOSE 174*  --  117*  BUN 18  --  12  CREATININE 1.28*  --  0.98  CALCIUM 9.3  --  9.0  MG  --  1.8 2.3  PHOS  --  2.2*  --    Liver Function Tests: Recent Labs  Lab 01/21/21 1312 01/22/21 0500  AST 36 20  ALT 37 29  ALKPHOS 29* 24*  BILITOT 1.0 1.1  PROT 6.8 6.3*  ALBUMIN 4.5 4.0   Recent Labs  Lab 01/21/21 1312 01/22/21 0500  LIPASE 5,838* 548*   No results for input(s): AMMONIA in the last 168 hours. CBC: Recent Labs  Lab 01/21/21 1312 01/22/21 0500  WBC 12.3* 10.1  NEUTROABS 7.9*  --   HGB 14.4 13.6  HCT 40.4 39.1  MCV 86.9 88.7  PLT 270 206   Cardiac Enzymes: No results for input(s): CKTOTAL, CKMB, CKMBINDEX, TROPONINI in the last 168 hours. BNP: Invalid input(s): POCBNP CBG: No results for input(s): GLUCAP in the last 168 hours. D-Dimer No results for input(s): DDIMER in the last 72 hours. Hgb A1c No results for input(s): HGBA1C in the last 72 hours. Lipid Profile Recent Labs    01/21/21 1513  TRIG  149   Thyroid function studies No results for input(s): TSH, T4TOTAL, T3FREE, THYROIDAB in the last 72 hours.  Invalid input(s): FREET3 Anemia work up No results for input(s): VITAMINB12, FOLATE, FERRITIN, TIBC, IRON, RETICCTPCT in the last 72 hours. Urinalysis No results found for: COLORURINE, APPEARANCEUR, LABSPEC, PHURINE, GLUCOSEU, HGBUR, BILIRUBINUR, KETONESUR, PROTEINUR, UROBILINOGEN, NITRITE, LEUKOCYTESUR Sepsis Labs Invalid input(s): PROCALCITONIN,  WBC,  LACTICIDVEN Microbiology Recent Results (from the past 240 hour(s))  Resp Panel by RT-PCR (Flu A&B, Covid) Nasopharyngeal Swab     Status: None   Collection Time: 01/21/21  2:14 PM   Specimen: Nasopharyngeal Swab; Nasopharyngeal(NP) swabs in vial transport medium  Result Value Ref Range Status   SARS Coronavirus 2 by RT PCR NEGATIVE NEGATIVE Final    Comment: (NOTE) SARS-CoV-2 target nucleic acids are NOT DETECTED.  The  SARS-CoV-2 RNA is generally detectable in upper respiratory specimens during the acute phase of infection. The lowest concentration of SARS-CoV-2 viral copies this assay can detect is 138 copies/mL. A negative result does not preclude SARS-Cov-2 infection and should not be used as the sole basis for treatment or other patient management decisions. A negative result may occur with  improper specimen collection/handling, submission of specimen other than nasopharyngeal swab, presence of viral mutation(s) within the areas targeted by this assay, and inadequate number of viral copies(<138 copies/mL). A negative result must be combined with clinical observations, patient history, and epidemiological information. The expected result is Negative.  Fact Sheet for Patients:  BloggerCourse.comhttps://www.fda.gov/media/152166/download  Fact Sheet for Healthcare Providers:  SeriousBroker.ithttps://www.fda.gov/media/152162/download  This test is no t yet approved or cleared by the [place] FDA and  has been authorized for detection and/or diagnosis of SARS-CoV-2 by FDA under an Emergency Use Authorization (EUA). This EUA will remain  in effect (meaning this test can be used) for the duration of the COVID-19 declaration under Section 564(b)(1) of the Act, 21 U.S.C.section 360bbb-3(b)(1), unless the authorization is terminated  or revoked sooner.       Influenza A by PCR NEGATIVE NEGATIVE Final   Influenza B by PCR NEGATIVE NEGATIVE Final    Comment: (NOTE) The Xpert Xpress SARS-CoV-2/FLU/RSV plus assay is intended as an aid in the diagnosis of influenza from Nasopharyngeal swab specimens and should not be used as a sole basis for treatment. Nasal washings and aspirates are unacceptable for Xpert Xpress SARS-CoV-2/FLU/RSV testing.  Fact Sheet for Patients: BloggerCourse.comhttps://www.fda.gov/media/152166/download  Fact Sheet for Healthcare Providers: SeriousBroker.ithttps://www.fda.gov/media/152162/download  This test is not yet approved or  cleared by the [place] FDA and has been authorized for detection and/or diagnosis of SARS-CoV-2 by FDA under an Emergency Use Authorization (EUA). This EUA will remain in effect (meaning this test can be used) for the duration of the COVID-19 declaration under Section 564(b)(1) of the Act, 21 U.S.C. section 360bbb-3(b)(1), unless the authorization is terminated or revoked.  Performed at [facility], [address] [place], [place] [id number]      Time coordinating discharge: Over 30 minutes  SIGNED:   Azucena FallenWilliam C Braxtin Bamba, DO [facility] 01/22/2021, 2:37 PM Pager   If 7PM-7AM, please contact night-coverage [email or web address]

## 2021-01-22 NOTE — Progress Notes (Signed)
Patient ready for discahrge and doing well

## 2023-10-03 ENCOUNTER — Other Ambulatory Visit: Payer: Self-pay

## 2023-10-03 ENCOUNTER — Encounter: Payer: Self-pay | Admitting: Radiology

## 2023-10-03 ENCOUNTER — Emergency Department
Admission: EM | Admit: 2023-10-03 | Discharge: 2023-10-03 | Disposition: A | Discharge status: Home / Self Care | Payer: 59 | Attending: Emergency Medicine | Admitting: Emergency Medicine

## 2023-10-03 ENCOUNTER — Emergency Department: Payer: 59

## 2023-10-03 DIAGNOSIS — Z20822 Contact with and (suspected) exposure to covid-19: Secondary | ICD-10-CM | POA: Diagnosis not present

## 2023-10-03 DIAGNOSIS — J069 Acute upper respiratory infection, unspecified: Secondary | ICD-10-CM | POA: Diagnosis not present

## 2023-10-03 DIAGNOSIS — R0981 Nasal congestion: Secondary | ICD-10-CM | POA: Diagnosis present

## 2023-10-03 DIAGNOSIS — J210 Acute bronchiolitis due to respiratory syncytial virus: Secondary | ICD-10-CM | POA: Insufficient documentation

## 2023-10-03 LAB — RESP PANEL BY RT-PCR (RSV, FLU A&B, COVID)  RVPGX2
Influenza A by PCR: NEGATIVE
Influenza B by PCR: NEGATIVE
Resp Syncytial Virus by PCR: POSITIVE — AB
SARS Coronavirus 2 by RT PCR: NEGATIVE

## 2023-10-03 MED ORDER — ALBUTEROL SULFATE (2.5 MG/3ML) 0.083% IN NEBU
5.0000 mg | INHALATION_SOLUTION | Freq: Once | RESPIRATORY_TRACT | Status: AC
Start: 2023-10-03 — End: 2023-10-03
  Administered 2023-10-03: 5 mg via RESPIRATORY_TRACT
  Filled 2023-10-03: qty 6

## 2023-10-03 MED ORDER — DEXAMETHASONE SODIUM PHOSPHATE 10 MG/ML IJ SOLN
10.0000 mg | Freq: Once | INTRAMUSCULAR | Status: AC
  Administered 2023-10-03: 10 mg via INTRAMUSCULAR
  Filled 2023-10-03: qty 1

## 2023-10-03 MED ORDER — ALBUTEROL SULFATE HFA 108 (90 BASE) MCG/ACT IN AERS: 2.0000 | INHALATION_SPRAY | Freq: Four times a day (QID) | RESPIRATORY_TRACT | 2 refills | Status: DC | PRN

## 2023-10-03 MED ORDER — PREDNISONE 50 MG PO TABS: 50.0000 mg | ORAL_TABLET | Freq: Every day | ORAL | 0 refills | Status: AC

## 2023-10-03 NOTE — Discharge Instructions (Addendum)
 You have been diagnosed with RSV bronchiolitis.  Please take plenty of fluids.  Please take medications as advised.  If you have new symptoms or worsening symptoms follow-up with your PCP or come back to ED

## 2023-10-03 NOTE — ED Triage Notes (Signed)
 Pt states that he started having a cold about a week ago and has tried to ride it out at home but it has just gotten worse. PT with congestion, and cough. Pt denies any fever and states he doesn't even feel that bad.

## 2023-10-03 NOTE — ED Provider Notes (Signed)
 [facility] Provider Note    Event Date/Time   First MD Initiated Contact with Patient 10/03/23 2242     (approximate)   History   Nasal Congestion   HPI  Christopher Savage is a 48 y.o. male who presents today with history of 8 days of coughing, hoarseness, unable to sleep, difficulty breathing.      Physical Exam   Triage Vital Signs: ED Triage Vitals  Encounter Vitals Group     BP 10/03/23 2155 (!) 152/110     Systolic BP Percentile --      Diastolic BP Percentile --      Pulse Rate 10/03/23 2155 73     Resp 10/03/23 2155 20     Temp 10/03/23 2155 98.4 F (36.9 C)     Temp Source 10/03/23 2155 Oral     SpO2 10/03/23 2155 93 %     Weight 10/03/23 2153 220 lb (99.8 kg)     Height 10/03/23 2153 6' 4 (1.93 m)     Head Circumference --      Peak Flow --      Pain Score --      Pain Loc --      Pain Education --      Exclude from Growth Chart --     Most recent vital signs: Vitals:   10/03/23 2155  BP: (!) 152/110  Pulse: 73  Resp: 20  Temp: 98.4 F (36.9 C)  SpO2: 93%     Constitutional: Alert  Eyes: Conjunctivae are normal.  Head: Atraumatic. Nose: No congestion/rhinnorhea. Mouth/Throat: Mucous membranes are moist.  No tonsillar exudate Neck: Painless ROM.  Cardiovascular:   Good peripheral circulation. Respiratory: Normal respiratory effort.  No retractions..  Expiratory wheezing bilateral Gastrointestinal: Soft and nontender.  Musculoskeletal:  no deformity Neurologic:  MAE spontaneously. No gross focal neurologic deficits are appreciated.  Skin:  Skin is warm, dry and intact. No rash noted. Psychiatric: Mood and affect are normal. Speech and behavior are normal.    ED Results / Procedures / Treatments   Labs (all labs ordered are listed, but only abnormal results are displayed) Labs Reviewed  RESP PANEL BY RT-PCR (RSV, FLU A&B, COVID)  [id number] - Abnormal; Notable for the following components:      Result Value    Resp Syncytial Virus by PCR POSITIVE (*)    All other components within normal limits     EKG    RADIOLOGY I independently reviewed and interpreted imaging and agree with radiologists findings.      PROCEDURES:  Critical Care performed:   Procedures   MEDICATIONS ORDERED IN ED: Medications  albuterol  (PROVENTIL ) (2.5 MG/3ML) 0.083% nebulizer solution 5 mg (has no administration in time range)  dexamethasone  (DECADRON ) injection 10 mg (has no administration in time range)     IMPRESSION / MDM / ASSESSMENT AND PLAN / ED COURSE  I reviewed the triage vital signs and the nursing notes.  Differential diagnosis includes, but is not limited to, flu, COVID, asthma, pneumonia, upper respiratory infection  Patient's presentation is most consistent with acute complicated illness / injury requiring diagnostic workup.   Patient's diagnosis is consistent with RSV. I independently reviewed and interpreted imaging and agree with radiologists findings. Labs are rea reassuring. I did review the patient's allergies and medications. Patient will be discharged home with prescriptions for albuterol , prednisone . Patient is to follow up with PCP as needed or otherwise directed. Patient is given ED precautions to return to  the ED for any worsening or new symptoms. Discussed plan of care with patient, answered all of patient's questions, Patient agreeable to plan of care. Advised patient to take medications according to the instructions on the label. Discussed possible side effects of new medications. Patient verbalized understanding. Clinical Course as of 10/03/23 2305  Sat Oct 03, 2023  2255 DG Chest 2 View No active cardiopulmonary disease [AE]  2259 Resp panel by RT-PCR (RSV, Flu A&B, Covid) Anterior Nasal Swab(!) R SV positive [AE]    Clinical Course User Index [AE] Janit Kast, PA-C     FINAL CLINICAL IMPRESSION(S) / ED DIAGNOSES   Final diagnoses:  Upper respiratory tract  infection, unspecified type  RSV (acute bronchiolitis due to respiratory syncytial virus)     Rx / DC Orders   ED Discharge Orders          Ordered    albuterol  (VENTOLIN  HFA) 108 (90 Base) MCG/ACT inhaler  Every 6 hours PRN        10/03/23 2259    predniSONE  (DELTASONE ) 50 MG tablet  Daily with breakfast        10/03/23 2259             Note:  This document was prepared using Dragon voice recognition software and may include unintentional dictation errors.   Janit Kast, PA-C 10/03/23 2306    Angelena Smalls, MD 10/06/23 [phone]

## 2023-10-09 NOTE — Progress Notes (Signed)
 Chief Complaint  Patient presents with  . sinus   . chest congestion     HPI  Christopher Savage is a 48 y.o. here for an acute issue.  He has a PMH of HTN, HLD, GERD, anxiety/depression who describes couple weeks now of respiratory symptoms.  Went to the emergency room last week and diagnosed with RSV.  Was given prednisone  with some benefit though minimal.  Still with wheezing, rattling, fatigue.    ROS  Pertinent items are noted in HPI.  Outpatient Encounter Medications as of 10/09/2023  Medication Sig Dispense Refill  . escitalopram  oxalate (LEXAPRO ) 10 MG tablet Take 1 tablet (10 mg total) by mouth once daily 90 tablet 3  . escitalopram  oxalate (LEXAPRO ) 20 MG tablet Take 1 tablet (20 mg total) by mouth once daily 90 tablet 3  . omeprazole (PRILOSEC) 20 MG DR capsule Take 20 mg by mouth once daily    . traZODone  (DESYREL ) 50 MG tablet Take 50 mg by mouth nightly (Patient not taking: Reported on 10/09/2023)     No facility-administered encounter medications on file as of 10/09/2023.    Allergies as of 10/09/2023 - Reviewed 04/06/2023  Allergen Reaction Noted  . Sulfa (sulfonamide antibiotics) Hives 01/11/2018    Past Medical History:  Diagnosis Date  . Allergy 09/1983   Grasses and Pollen  . Anxiety 09/1987  . Depression 09/1990  . GERD (gastroesophageal reflux disease) 09/2001   20mg  Omeprezole Daily  . Hyperlipidemia, mixed 03/28/2020  . Hypertension 09/1992   140/90 seen at times in my life  . Substance abuse (CMS-HCC) 12/2017   Sober since this date    Past Surgical History:  Procedure Laterality Date  . COLONOSCOPY  04/18/2022   Diverticulosis/otherwise normal/repeat 10 yrs/TKT  . TONSILLECTOMY      Vitals:   10/09/23 0957  BP: 126/80  Pulse: 67    Physical Exam  General. Well appearing; NAD; VS reviewed     HEENT: Sclera and conjunctiva clear; EOMI,  Neck. Supple. No thyromegaly, lymphadenopathy. Lungs. Respirations unlabored; wheezing, rhonchi  bilaterally. Cardiovascular. Heart regular rate and rhythm without murmurs, gallops, or rubs. Extremities:  No edema. Skin. Normal color and turgor Neurologic. Alert and oriented x3   Assessment and Plan 1. Bronchitis 2 weeks of symptoms with testing positive for RSV last week.  Still not improving.  Chest x-ray was clear last week. -     levoFLOXacin (LEVAQUIN) 500 MG tablet; Take 1 tablet (500 mg total) by mouth once daily for 7 days -     predniSONE  (DELTASONE ) 10 MG tablet; 6 tabs x 1 day, 5 tabs x 1 day, 4 tabs x 1 day, etc...    I have personally performed this service.  [address] [place], [place]

## 2023-11-06 ENCOUNTER — Other Ambulatory Visit: Payer: Self-pay

## 2023-11-06 ENCOUNTER — Observation Stay
Admission: EM | Admit: 2023-11-06 | Discharge: 2023-11-08 | Disposition: A | Discharge status: Home / Self Care | Payer: 59 | Attending: Internal Medicine | Admitting: Internal Medicine

## 2023-11-06 ENCOUNTER — Emergency Department: Payer: 59

## 2023-11-06 DIAGNOSIS — K219 Gastro-esophageal reflux disease without esophagitis: Secondary | ICD-10-CM | POA: Insufficient documentation

## 2023-11-06 DIAGNOSIS — K861 Other chronic pancreatitis: Secondary | ICD-10-CM

## 2023-11-06 DIAGNOSIS — R1033 Periumbilical pain: Secondary | ICD-10-CM | POA: Diagnosis present

## 2023-11-06 DIAGNOSIS — D72829 Elevated white blood cell count, unspecified: Secondary | ICD-10-CM | POA: Insufficient documentation

## 2023-11-06 DIAGNOSIS — F32A Depression, unspecified: Secondary | ICD-10-CM | POA: Insufficient documentation

## 2023-11-06 DIAGNOSIS — K2100 Gastro-esophageal reflux disease with esophagitis, without bleeding: Secondary | ICD-10-CM | POA: Diagnosis not present

## 2023-11-06 DIAGNOSIS — K859 Acute pancreatitis without necrosis or infection, unspecified: Secondary | ICD-10-CM | POA: Diagnosis not present

## 2023-11-06 LAB — COMPREHENSIVE METABOLIC PANEL
ALT: 44 U/L (ref 0–44)
AST: 24 U/L (ref 15–41)
Albumin: 4.4 g/dL (ref 3.5–5.0)
Alkaline Phosphatase: 32 U/L — ABNORMAL LOW (ref 38–126)
Anion gap: 13 (ref 5–15)
BUN: 18 mg/dL (ref 6–20)
CO2: 25 mmol/L (ref 22–32)
Calcium: 9.2 mg/dL (ref 8.9–10.3)
Chloride: 97 mmol/L — ABNORMAL LOW (ref 98–111)
Creatinine, Ser: 1.03 mg/dL (ref 0.61–1.24)
GFR, Estimated: >60 mL/min (ref >60)
Glucose, Bld: 143 mg/dL — ABNORMAL HIGH (ref 70–99)
Potassium: 4 mmol/L (ref 3.5–5.1)
Sodium: 135 mmol/L (ref 135–145)
Total Bilirubin: 1.5 mg/dL — ABNORMAL HIGH (ref 0.0–1.2)
Total Protein: 7.1 g/dL (ref 6.5–8.1)

## 2023-11-06 LAB — CBC
HCT: 43.5 % (ref 39.0–52.0)
Hemoglobin: 15.6 g/dL (ref 13.0–17.0)
MCH: 31.4 pg (ref 26.0–34.0)
MCHC: 35.9 g/dL (ref 30.0–36.0)
MCV: 87.5 fL (ref 80.0–100.0)
Platelets: 246 10*3/uL (ref 150–400)
RBC: 4.97 MIL/uL (ref 4.22–5.81)
RDW: 12.9 % (ref 11.5–15.5)
WBC: 12.5 10*3/uL — ABNORMAL HIGH (ref 4.0–10.5)
nRBC: 0 % (ref 0.0–0.2)

## 2023-11-06 LAB — LIPASE, BLOOD: Lipase: 935 U/L — ABNORMAL HIGH (ref 11–51)

## 2023-11-06 LAB — TRIGLYCERIDES: Triglycerides: 96 mg/dL (ref <150)

## 2023-11-06 MED ORDER — ACETAMINOPHEN 325 MG PO TABS: 650.0000 mg | ORAL_TABLET | Freq: Four times a day (QID) | ORAL | Status: DC | PRN

## 2023-11-06 MED ORDER — HYDROMORPHONE HCL 1 MG/ML IJ SOLN
1.0000 mg | Freq: Once | INTRAMUSCULAR | Status: AC
  Administered 2023-11-06: 1 mg via INTRAVENOUS
  Filled 2023-11-06: qty 1

## 2023-11-06 MED ORDER — LACTATED RINGERS IV SOLN: INTRAVENOUS | Status: AC

## 2023-11-06 MED ORDER — ONDANSETRON HCL 4 MG/2ML IJ SOLN
4.0000 mg | Freq: Three times a day (TID) | INTRAMUSCULAR | Status: DC | PRN
  Administered 2023-11-07: 4 mg via INTRAVENOUS
  Filled 2023-11-06: qty 2

## 2023-11-06 MED ORDER — OXYCODONE-ACETAMINOPHEN 5-325 MG PO TABS
1.0000 | ORAL_TABLET | ORAL | Status: DC | PRN
Start: 2023-11-06 — End: 2023-11-06
  Administered 2023-11-06: 1 via ORAL
  Filled 2023-11-06: qty 1

## 2023-11-06 MED ORDER — IOHEXOL 350 MG/ML SOLN
100.0000 mL | Freq: Once | INTRAVENOUS | Status: AC | PRN
  Administered 2023-11-06: 100 mL via INTRAVENOUS

## 2023-11-06 MED ORDER — ENOXAPARIN SODIUM 40 MG/0.4ML IJ SOSY
40.0000 mg | PREFILLED_SYRINGE | INTRAMUSCULAR | Status: DC
  Administered 2023-11-07 – 2023-11-08 (×2): 40 mg via SUBCUTANEOUS
  Filled 2023-11-06 (×2): qty 0.4

## 2023-11-06 MED ORDER — ONDANSETRON HCL 4 MG/2ML IJ SOLN
4.0000 mg | Freq: Once | INTRAMUSCULAR | Status: AC
  Administered 2023-11-06: 4 mg via INTRAVENOUS
  Filled 2023-11-06: qty 2

## 2023-11-06 MED ORDER — OXYCODONE-ACETAMINOPHEN 5-325 MG PO TABS: 1.0000 | ORAL_TABLET | ORAL | Status: DC | PRN

## 2023-11-06 MED ORDER — HYDROMORPHONE HCL 1 MG/ML IJ SOLN
1.0000 mg | INTRAMUSCULAR | Status: DC | PRN
  Administered 2023-11-07 (×7): 1 mg via INTRAVENOUS
  Filled 2023-11-06 (×7): qty 1

## 2023-11-06 MED ORDER — LACTATED RINGERS IV BOLUS
1000.0000 mL | Freq: Once | INTRAVENOUS | Status: AC
  Administered 2023-11-06: 1000 mL via INTRAVENOUS

## 2023-11-06 MED ORDER — HYDROMORPHONE HCL 1 MG/ML IJ SOLN
1.0000 mg | Freq: Once | INTRAMUSCULAR | Status: AC
Start: 2023-11-06 — End: 2023-11-06
  Administered 2023-11-06: 1 mg via INTRAVENOUS
  Filled 2023-11-06: qty 1

## 2023-11-06 NOTE — H&P (Addendum)
 History and Physical    Christopher Savage FMW:[id number] DOB: 10/22/1975 DOA: 11/06/2023  Referring MD/NP/PA:   PCP: Rudolpho Norleen BIRCH, MD   Patient coming from:  The patient is coming from home.     Chief Complaint: abdominal pain  HPI: Christopher Savage is a 48 y.o. male with medical history significant of  alcohol abuse in remission, pancreatitis, depression, who presents abdominal pain.  Patient states that he has abdominal pain the past 2 days, which has been progressively worsening, associated with multiple nonbilious nonbloody vomiting.  The abdominal pain is located in the middle abdomen, constant, severe, nonradiating, aggravated by eating. No diarrhea.  No fever or chills.  Patient does not have chest pain, cough, SOB.  No symptoms of UTI.  Patient has a poor appetite and decreased oral intake.   Data reviewed independently and ED Course: pt was found to have lipase 935, WBC 12.5, GFR> 60, triglyceride 96.  Temperature normal, blood pressure was 83/91, heart rate 93, RR 20, oxygen saturation 98% on room air.  Patient is placed on MedSurg bed for ablation.  CT of the abdomen/pelvis: 1. Acute on chronic pancreatitis, with no acute fluid collection or abscess identified. No evidence of necrosis. 2. 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 Given the interval increase in size since prior imaging, consideration could be given to nonemergent dedicated pancreatic MRI. 3. Trace free fluid within the abdomen and pelvis, likely reactive. 4. Mild diffuse hepatic steatosis.    EKG:   Not done in ED, will get one.    Review of Systems:   General: no fevers, chills, no body weight gain, has poor appetite, has fatigue HEENT: no blurry vision, hearing changes or sore throat Respiratory: no dyspnea, coughing, wheezing CV: no chest pain, no  palpitations GI: has nausea, vomiting, abdominal pain, no diarrhea, constipation GU: no dysuria, burning on urination, increased urinary frequency, hematuria  Ext: no leg edema Neuro: no unilateral weakness, numbness, or tingling, no vision change or hearing loss Skin: no rash, no skin tear. MSK: No muscle spasm, no deformity, no limitation of range of movement in spin Heme: No easy bruising.  Travel history: No recent long distant travel.   Allergy:  Allergies  Allergen Reactions   Sulfa Antibiotics Hives    Past Medical History:  Diagnosis Date   Depression    Pancreatitis     Past Surgical History:  Procedure Laterality Date   COLONOSCOPY     EUS N/A 10/11/2020   Procedure: FULL UPPER ENDOSCOPIC ULTRASOUND (EUS) RADIAL;  Surgeon: Glena Mt, MD;  Location: [facility] ENDOSCOPY;  Service: Endoscopy;  Laterality: N/A;   UPPER GI ENDOSCOPY      Social History:  reports that he has never smoked. He has never used smokeless tobacco. He reports that he does not currently use alcohol. He reports that he does not currently use drugs.  Family History:  Family History  Problem Relation Age of Onset   Hypertension Mother    Mental illness Neg Hx      Prior to Admission medications   Medication Sig Start Date End Date Taking? Authorizing Provider  albuterol  (VENTOLIN  HFA) 108 (90 Base) MCG/ACT inhaler Inhale 2 puffs into the lungs every 6 (six) hours as needed for wheezing or shortness of breath. 10/03/23   Janit Kast, PA-C  escitalopram  (LEXAPRO ) 20 MG tablet Take 1.5 tablets (30 mg total) by mouth daily.  03/31/18   Eappen, Saramma, MD  Multiple Vitamin (MULTIVITAMIN WITH MINERALS) TABS tablet Take 1 tablet by mouth daily. 12/31/17   Patel, Sona, MD  omeprazole (PRILOSEC) 20 MG capsule Take 20 mg by mouth daily.    [provider]  traZODone  (DESYREL ) 50 MG tablet TAKE 1-2 TABLETS (50-100 MG TOTAL) BY MOUTH AT BEDTIME AS NEEDED FOR SLEEP 03/30/18   Eappen, Saramma, MD     Physical Exam: Vitals:   11/06/23 2014 11/06/23 2100 11/06/23 2308 11/07/23 0007  BP:   (!) 150/98 (!) 142/92  Pulse:   76 72  Resp: 16  17   Temp:   98.2 F (36.8 C)   TempSrc:   Oral   SpO2:   96% 95%  Weight:  103.6 kg     General: Not in acute distress HEENT:       Eyes: PERRL, EOMI, no jaundice       ENT: No discharge from the ears and nose, no pharynx injection, no tonsillar enlargement.        Neck: No JVD, no bruit, no mass felt. Heme: No neck lymph node enlargement. Cardiac: S1/S2, RRR, No murmurs, No gallops or rubs. Respiratory: No rales, wheezing, rhonchi or rubs. GI: Soft, nondistended, has tenderness in central abdomen, no rebound pain, no organomegaly, BS present. GU: No hematuria Ext: No pitting leg edema bilaterally. 1+DP/PT pulse bilaterally. Musculoskeletal: No joint deformities, No joint redness or warmth, no limitation of ROM in spin. Skin: No rashes.  Neuro: Alert, oriented X3, cranial nerves II-XII grossly intact, moves all extremities normally. Psych: Patient is not psychotic, no suicidal or hemocidal ideation.  Labs on Admission: I have personally reviewed following labs and imaging studies  CBC: Recent Labs  Lab 11/06/23 1650  WBC 12.5*  HGB 15.6  HCT 43.5  MCV 87.5  PLT 246   Basic Metabolic Panel: Recent Labs  Lab 11/06/23 1650  NA 135  K 4.0  CL 97*  CO2 25  GLUCOSE 143*  BUN 18  CREATININE 1.03  CALCIUM 9.2   GFR: Estimated Creatinine Clearance: 106.5 mL/min (by C-G formula based on SCr of 1.03 mg/dL). Liver Function Tests: Recent Labs  Lab 11/06/23 1650  AST 24  ALT 44  ALKPHOS 32*  BILITOT 1.5*  PROT 7.1  ALBUMIN 4.4   Recent Labs  Lab 11/06/23 1650  LIPASE 935*   No results for input(s): AMMONIA in the last 168 hours. Coagulation Profile: No results for input(s): INR, PROTIME in the last 168 hours. Cardiac Enzymes: No results for input(s): CKTOTAL, CKMB, CKMBINDEX, TROPONINI in the last 168  hours. BNP (last 3 results) No results for input(s): PROBNP in the last 8760 hours. HbA1C: No results for input(s): HGBA1C in the last 72 hours. CBG: No results for input(s): GLUCAP in the last 168 hours. Lipid Profile: Recent Labs    11/06/23 1650  TRIG 96   Thyroid Function Tests: No results for input(s): TSH, T4TOTAL, FREET4, T3FREE, THYROIDAB in the last 72 hours. Anemia Panel: No results for input(s): VITAMINB12, FOLATE, FERRITIN, TIBC, IRON, RETICCTPCT in the last 72 hours. Urine analysis: No results found for: COLORURINE, APPEARANCEUR, LABSPEC, PHURINE, GLUCOSEU, HGBUR, BILIRUBINUR, KETONESUR, PROTEINUR, UROBILINOGEN, NITRITE, LEUKOCYTESUR Sepsis Labs: @LABRCNTIP (procalcitonin:4,lacticidven:4) )No results found for this or any previous visit (from the past 240 hours).   Radiological Exams on Admission:   Assessment/Plan Principal Problem:   Acute on chronic pancreatitis (HCC) Active Problems:   Leukocytosis   GERD (gastroesophageal reflux disease)   Depression  Assessment and Plan:  Acute on chronic pancreatitis [facility]): Lipase of 835. CT scan showed acute on chronic pancreatitis, no necrosis or fluid collection.  Etiology is not clear.  Patient quit drinking alcohol 2019..   -will place in med-surg bed for observation -NPO for pancreatitis -IVF: 1LNS and then at 150 cc/hr -prn IV Dilaudid , Percocet, Tylenol  for pain control -prn IV zofran  for nausea -Check triglyceride level --> normal 96   Depression, prolonged -Continue home Lexapro    GERD: -Protonix    Leukocytosis: WBC 12.5 No signs of infection, likely reactive Follow-up -Follow-up by CBC    DVT ppx: SQ Lovenox   Code Status: Full code     Family Communication:     not done, no family member is at bed side.     Disposition Plan:  Anticipate discharge back to previous environment  Consults called:  none  Admission status and Level of care:  Med-Surg:    for obs as inpt        Dispo: The patient is from: Home              Anticipated d/c is to: Home              Anticipated d/c date is: 1 day              Patient currently is not medically stable to d/c.    Severity of Illness:  The appropriate patient status for this patient is OBSERVATION. Observation status is judged to be reasonable and necessary in order to provide the required intensity of service to ensure the patient's safety. The patient's presenting symptoms, physical exam findings, and initial radiographic and laboratory data in the context of their medical condition is felt to place them at decreased risk for further clinical deterioration. Furthermore, it is anticipated that the patient will be medically stable for discharge from the hospital within 2 midnights of admission.        Date of Service 11/07/2023    Caleb Exon [facility]   If 7PM-7AM, please contact night-coverage [email or web address] 11/07/2023, 1:07 AM

## 2023-11-06 NOTE — ED Provider Notes (Signed)
 [facility] Provider Note    Event Date/Time   First MD Initiated Contact with Patient 11/06/23 TRENNA     (approximate)   History   Abdominal Pain   HPI  Christopher Savage is a 47 y.o. male who presents to the ED for evaluation of Abdominal Pain   Review a GI clinic visit from 2023.  History of colonic polyp removal.  Pancreatitis in the past as recently as 2022.  Previous ethanol abuse.  Patient presents with recurrence of typical pancreatitis symptoms in the past couple days.  Centralized abdominal pain, nausea and vomiting.  No fevers or chest pain or syncope.   Physical Exam   Triage Vital Signs: ED Triage Vitals [11/06/23 1644]  Encounter Vitals Group     BP (!) 156/117     Systolic BP Percentile      Diastolic BP Percentile      Pulse Rate 93     Resp 20     Temp 98 F (36.7 C)     Temp Source Oral     SpO2 98 %     Weight      Height      Head Circumference      Peak Flow      Pain Score 10     Pain Loc      Pain Education      Exclude from Growth Chart     Most recent vital signs: Vitals:   11/06/23 2014 11/06/23 2308  BP:  (!) 150/98  Pulse:  76  Resp: 16 17  Temp:  98.2 F (36.8 C)  SpO2:  96%    General: Awake, no distress.  CV:  Good peripheral perfusion.  Resp:  Normal effort.  Abd:  No distention.  Periumbilical tenderness without peritoneal features. MSK:  No deformity noted.  Neuro:  No focal deficits appreciated. Other:     ED Results / Procedures / Treatments   Labs (all labs ordered are listed, but only abnormal results are displayed) Labs Reviewed  LIPASE, BLOOD - Abnormal; Notable for the following components:      Result Value   Lipase 935 (*)    All other components within normal limits  COMPREHENSIVE METABOLIC PANEL - Abnormal; Notable for the following components:   Chloride 97 (*)    Glucose, Bld 143 (*)    Alkaline Phosphatase 32 (*)    Total Bilirubin 1.5 (*)    All other components  within normal limits  CBC - Abnormal; Notable for the following components:   WBC 12.5 (*)    All other components within normal limits  TRIGLYCERIDES  LIPASE, BLOOD  HIV ANTIBODY (ROUTINE TESTING W REFLEX)  COMPREHENSIVE METABOLIC PANEL  CBC    EKG   RADIOLOGY CT abdomen/pelvis interpreted by me with peripancreatic stranding without pseudocyst or necrosis noted  Official radiology report(s): CT ABDOMEN PELVIS W CONTRAST Result Date: 11/06/2023 CLINICAL DATA:  Mid abdominal pain for 2 days, history of pancreatitis EXAM: CT ABDOMEN AND PELVIS WITH CONTRAST TECHNIQUE: Multidetector CT imaging of the abdomen and pelvis was performed using the standard protocol following bolus administration of intravenous contrast. RADIATION DOSE REDUCTION: This exam was performed according to the departmental dose-optimization program which includes automated exposure control, adjustment of the mA and/or kV according to patient size and/or use of iterative reconstruction technique. CONTRAST:  OMNIPAQUE  IOHEXOL  350 MG/ML SOLN COMPARISON:  01/21/2021 FINDINGS: Lower chest: No acute pleural or parenchymal lung disease. Hepatobiliary: Mild diffuse  hepatic steatosis. No focal liver abnormality. The gallbladder is unremarkable. No biliary duct dilation. Pancreas: Extensive peripancreatic inflammatory changes are noted consistent with acute pancreatitis. Edematous changes are seen within the pancreatic body. No acute fluid collection or abscess. Coarse calcifications are again seen within the uncinate process of the pancreas consistent sequela of chronic calcific pancreatitis. Lobular hyperdense region within the pancreatic head measuring up to 4.0 x 2.2 cm slightly increased since prior study, corresponding to the suspected chronic inflammatory pseudocyst as reported on prior MRI 08/10/2020 and CT 01/21/2021. No pancreatic duct dilation. Spleen: Normal in size without focal abnormality. Adrenals/Urinary Tract:  Adrenal glands are unremarkable. Kidneys are normal, without renal calculi, focal lesion, or hydronephrosis. Bladder is unremarkable. Stomach/Bowel: No bowel obstruction or ileus. Scattered diverticulosis of the descending and sigmoid colon without evidence of acute diverticulitis. Normal appendix right lower quadrant. No bowel wall thickening or inflammatory change. Vascular/Lymphatic: There are no acute vascular abnormalities. Splenic vein, SMV, and portal vein remain patent. No pathologic adenopathy. Reproductive: Prostate is unremarkable. Other: Trace free fluid within the abdomen and pelvis. No free intraperitoneal gas. No abdominal wall hernia. Musculoskeletal: No acute or destructive bony abnormalities. Reconstructed images demonstrate no additional findings. IMPRESSION: 1. Acute on chronic pancreatitis, with no acute fluid collection or abscess identified. No evidence of necrosis. 2. 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 Given the interval increase in size since prior imaging, consideration could be given to nonemergent dedicated pancreatic MRI. 3. Trace free fluid within the abdomen and pelvis, likely reactive. 4. Mild diffuse hepatic steatosis. Electronically Signed   By: Ozell Daring M.D.   On: 11/06/2023 20:15    PROCEDURES and INTERVENTIONS:  Procedures  Medications  oxyCODONE -acetaminophen  (PERCOCET/ROXICET) 5-325 MG per tablet 1 tablet (has no administration in time range)  HYDROmorphone  (DILAUDID ) injection 1 mg (has no administration in time range)  ondansetron  (ZOFRAN ) injection 4 mg (has no administration in time range)  acetaminophen  (TYLENOL ) tablet 650 mg (has no administration in time range)  lactated ringers  infusion ( Intravenous New Bag/Given 11/06/23 2112)  enoxaparin  (LOVENOX ) injection 40 mg (has no administration in time  range)  HYDROmorphone  (DILAUDID ) injection 1 mg (1 mg Intravenous Given 11/06/23 1904)  ondansetron  (ZOFRAN ) injection 4 mg (4 mg Intravenous Given 11/06/23 1906)  lactated ringers  bolus 1,000 mL (0 mLs Intravenous Stopped 11/06/23 2114)  iohexol  (OMNIPAQUE ) 350 MG/ML injection 100 mL (100 mLs Intravenous Contrast Given 11/06/23 2001)  HYDROmorphone  (DILAUDID ) injection 1 mg (1 mg Intravenous Given 11/06/23 2113)     IMPRESSION / MDM / ASSESSMENT AND PLAN / ED COURSE  I reviewed the triage vital signs and the nursing notes.  Differential diagnosis includes, but is not limited to, pancreatitis, pseudocyst, SBO, sepsis  {Patient presents with symptoms of an acute illness or injury that is potentially life-threatening  Patient presents with evidence of recurrence of acute pancreatitis requiring medical admission for supportive care.  Mild leukocytosis but I doubt infectious etiology of his symptoms.  Elevated lipase to 900.  No significant metabolic derangements.  CT, as above.  Consult medicine for admission  Clinical Course as of 11/06/23 2317  [place] Nov 06, 2023  2035 Reassessed, discussed workup and my recommendation for admission.  He is agreeable. [DS]    Clinical Course User Index [DS] Claudene Rover, MD     FINAL CLINICAL IMPRESSION(S) / ED DIAGNOSES   Final diagnoses:  Acute pancreatitis without  infection or necrosis, unspecified pancreatitis type  Periumbilical abdominal pain     Rx / DC Orders   ED Discharge Orders     None        Note:  This document was prepared using Dragon voice recognition software and may include unintentional dictation errors.   Claudene Rover, MD 11/06/23 [phone]

## 2023-11-06 NOTE — ED Triage Notes (Signed)
 Pt to ED via POV from home. Pt ambulatory to triage. Pt reports hx of pancreatitis and feels like he is having an episode. Pt reports mid abd pain started 2 days ago. Pt also report decreased appetite and N/V.

## 2023-11-07 DIAGNOSIS — K859 Acute pancreatitis without necrosis or infection, unspecified: Secondary | ICD-10-CM | POA: Diagnosis not present

## 2023-11-07 DIAGNOSIS — K2100 Gastro-esophageal reflux disease with esophagitis, without bleeding: Secondary | ICD-10-CM | POA: Diagnosis not present

## 2023-11-07 DIAGNOSIS — R1033 Periumbilical pain: Secondary | ICD-10-CM | POA: Diagnosis not present

## 2023-11-07 DIAGNOSIS — D72829 Elevated white blood cell count, unspecified: Secondary | ICD-10-CM | POA: Diagnosis not present

## 2023-11-07 LAB — CBC
HCT: 38.4 % — ABNORMAL LOW (ref 39.0–52.0)
Hemoglobin: 13.7 g/dL (ref 13.0–17.0)
MCH: 31.5 pg (ref 26.0–34.0)
MCHC: 35.7 g/dL (ref 30.0–36.0)
MCV: 88.3 fL (ref 80.0–100.0)
Platelets: 229 10*3/uL (ref 150–400)
RBC: 4.35 MIL/uL (ref 4.22–5.81)
RDW: 13 % (ref 11.5–15.5)
WBC: 11.6 10*3/uL — ABNORMAL HIGH (ref 4.0–10.5)
nRBC: 0 % (ref 0.0–0.2)

## 2023-11-07 LAB — LIPASE, BLOOD: Lipase: 366 U/L — ABNORMAL HIGH (ref 11–51)

## 2023-11-07 LAB — COMPREHENSIVE METABOLIC PANEL
ALT: 35 U/L (ref 0–44)
AST: 19 U/L (ref 15–41)
Albumin: 3.9 g/dL (ref 3.5–5.0)
Alkaline Phosphatase: 28 U/L — ABNORMAL LOW (ref 38–126)
Anion gap: 9 (ref 5–15)
BUN: 16 mg/dL (ref 6–20)
CO2: 27 mmol/L (ref 22–32)
Calcium: 8.7 mg/dL — ABNORMAL LOW (ref 8.9–10.3)
Chloride: 100 mmol/L (ref 98–111)
Creatinine, Ser: 0.99 mg/dL (ref 0.61–1.24)
GFR, Estimated: >60 mL/min (ref >60)
Glucose, Bld: 126 mg/dL — ABNORMAL HIGH (ref 70–99)
Potassium: 4.1 mmol/L (ref 3.5–5.1)
Sodium: 136 mmol/L (ref 135–145)
Total Bilirubin: 1.3 mg/dL — ABNORMAL HIGH (ref 0.0–1.2)
Total Protein: 6.3 g/dL — ABNORMAL LOW (ref 6.5–8.1)

## 2023-11-07 LAB — CBG MONITORING, ED: Glucose-Capillary: 111 mg/dL — ABNORMAL HIGH (ref 70–99)

## 2023-11-07 LAB — HIV ANTIBODY (ROUTINE TESTING W REFLEX): HIV Screen 4th Generation wRfx: NONREACTIVE

## 2023-11-07 MED ORDER — TRAZODONE HCL 50 MG PO TABS
50.0000 mg | ORAL_TABLET | Freq: Every evening | ORAL | Status: DC | PRN
  Administered 2023-11-07: 50 mg via ORAL
  Filled 2023-11-07: qty 1

## 2023-11-07 MED ORDER — ALBUTEROL SULFATE HFA 108 (90 BASE) MCG/ACT IN AERS: 2.0000 | INHALATION_SPRAY | Freq: Four times a day (QID) | RESPIRATORY_TRACT | Status: DC | PRN

## 2023-11-07 MED ORDER — ESCITALOPRAM OXALATE 10 MG PO TABS
30.0000 mg | ORAL_TABLET | Freq: Every day | ORAL | Status: DC
  Administered 2023-11-07 – 2023-11-08 (×2): 30 mg via ORAL
  Filled 2023-11-07 (×2): qty 3

## 2023-11-07 MED ORDER — ADULT MULTIVITAMIN W/MINERALS CH
1.0000 | ORAL_TABLET | Freq: Every day | ORAL | Status: DC
  Administered 2023-11-07 – 2023-11-08 (×2): 1 via ORAL
  Filled 2023-11-07 (×2): qty 1

## 2023-11-07 MED ORDER — PANTOPRAZOLE SODIUM 40 MG PO TBEC
40.0000 mg | DELAYED_RELEASE_TABLET | Freq: Every day | ORAL | Status: DC
  Administered 2023-11-07 – 2023-11-08 (×2): 40 mg via ORAL
  Filled 2023-11-07 (×2): qty 1

## 2023-11-07 MED ORDER — ALBUTEROL SULFATE (2.5 MG/3ML) 0.083% IN NEBU: 2.5000 mg | INHALATION_SOLUTION | Freq: Four times a day (QID) | RESPIRATORY_TRACT | Status: DC | PRN

## 2023-11-07 NOTE — Plan of Care (Signed)

## 2023-11-07 NOTE — ED Notes (Signed)
Pt ambulated to bathroom independently, gait steady.

## 2023-11-07 NOTE — Progress Notes (Signed)
  Progress Note   Patient: Christopher Savage FMW:[id number] DOB: Feb 09, 1976 DOA: 11/06/2023     0 DOS: the patient was seen and examined on 11/07/2023   Brief hospital course: Taken from H&P.  Christopher Savage is a 48 y.o. male with medical history significant of  alcohol abuse in remission, pancreatitis, depression, who presents abdominal pain, for the past 2 days, progressively worsening, associated with multiple nonbilious nonbloody vomiting.  On presentation vitals stable, labs pertinent for lipase 935, leukocytosis at 12.5, triglyceride 96.  CT abdomen and pelvis with concern of acute on chronic pancreatitis, no evidence of fluid collection, abscess or necrosis.  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 Given the interval increase in size since prior imaging, consideration could be given to nonemergent dedicated pancreatic MRI.  Also had mild diffuse hepatic steatosis and trace free fluid within the abdomen and pelvis.  Patient was admitted for acute on chronic pancreatitis.  2/8: Vitals stable.  Improving leukocytosis at 11.6, lipase improved to 366, T. bili 1.3.  Improving pain and vomiting resolved, starting on clear liquid diet.    Assessment and Plan: * Acute on chronic pancreatitis (HCC) Clinically seems improving with improving lipase.  Pain improving and no vomiting today. -Started on clear liquid diet -Continue with IV fluid for today -Continue with pain management as needed  Leukocytosis Slowly improving. -Continue to monitor  GERD (gastroesophageal reflux disease) -Continue Protonix   Depression -Continue home Lexapro    Subjective: Patient was seen and examined today.  His abdominal pain seems improving, still having mild generalized tenderness.  No more nausea or vomiting.  Physical Exam: Vitals:    11/07/23 1128 11/07/23 1220 11/07/23 1313 11/07/23 1355  BP: (!) 123/93 139/87  (!) 130/91  Pulse: 86 80  81  Resp: 16 18  17   Temp:   99.1 F (37.3 C) 99.8 F (37.7 C)  TempSrc:   Oral Oral  SpO2: 94% 97%  96%  Weight:       General.  Well-developed gentleman, in no acute distress. Pulmonary.  Lungs clear bilaterally, normal respiratory effort. CV.  Regular rate and rhythm, no JVD, rub or murmur. Abdomen.  Soft, nontender, nondistended, BS positive. CNS.  Alert and oriented .  No focal neurologic deficit. Extremities.  No edema, no cyanosis, pulses intact and symmetrical. Psychiatry.  Judgment and insight appears normal.   Data Reviewed: Prior data reviewed  Family Communication: Discussed with patient  Disposition: Status is: Observation The patient will require care spanning > 2 midnights and should be moved to inpatient because: Severity of illness  Planned Discharge Destination: Home  DVT prophylaxis.  Lovenox  Time spent: 45 minutes  This record has been created using Conservation officer, historic buildings. Errors have been sought and corrected,but may not always be located. Such creation errors do not reflect on the standard of care.   Author: Amaryllis Dare, MD 11/07/2023 1:56 PM  For on call review [email or web address].

## 2023-11-07 NOTE — Assessment & Plan Note (Signed)
 Continue home Lexapro

## 2023-11-07 NOTE — ED Notes (Signed)
 RN called for labs, have informed phlebotomist Megan twice. Called her at [phone]. If not completed please call her directly.

## 2023-11-07 NOTE — Hospital Course (Addendum)
 Taken from H&P.  Christopher Savage is a 48 y.o. male with medical history significant of  alcohol abuse in remission, pancreatitis, depression, who presents abdominal pain, for the past 2 days, progressively worsening, associated with multiple nonbilious nonbloody vomiting.  On presentation vitals stable, labs pertinent for lipase 935, leukocytosis at 12.5, triglyceride 96.  CT abdomen and pelvis with concern of acute on chronic pancreatitis, no evidence of fluid collection, abscess or necrosis.  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 Given the interval increase in size since prior imaging, consideration could be given to nonemergent dedicated pancreatic MRI.  Also had mild diffuse hepatic steatosis and trace free fluid within the abdomen and pelvis.  Patient was admitted for acute on chronic pancreatitis.  2/8: Vitals stable.  Improving leukocytosis at 11.6, lipase improved to 366, T. bili 1.3.  Improving pain and vomiting resolved, starting on clear liquid diet.  2/9: Patient remained hemodynamically stable, nausea, vomiting and pain has been resolved.  Able to tolerate soft diet.  Leukocytosis resolved, mild persistent elevation of T. bili at 1.5, alkaline phosphatase low.  Patient need a pancreatic protocol MRI to rule out any underlying malignant lesion.  Patient apparently follow-up with the [facility] provider who are monitoring his pancreas, he will follow-up with them and have pancreatic MRI done at [facility].  Patient will continue the rest of his home medications and need to have a close follow-up with his providers for further assistance.

## 2023-11-07 NOTE — ED Notes (Signed)
 Advised nurse that patient has ready bed

## 2023-11-07 NOTE — ED Notes (Signed)
 Pt given chicken broth and water per his request and MD verbal order clear liquid diet.

## 2023-11-07 NOTE — Assessment & Plan Note (Signed)
-

## 2023-11-07 NOTE — ED Notes (Signed)
 MD at beside

## 2023-11-07 NOTE — Assessment & Plan Note (Signed)
Slowly improving.  Continue to monitor

## 2023-11-07 NOTE — Assessment & Plan Note (Signed)
 Clinically seems improving with improving lipase.  Pain improving and no vomiting today. -Started on clear liquid diet -Continue with IV fluid for today -Continue with pain management as needed

## 2023-11-08 DIAGNOSIS — D72829 Elevated white blood cell count, unspecified: Secondary | ICD-10-CM | POA: Diagnosis not present

## 2023-11-08 DIAGNOSIS — K861 Other chronic pancreatitis: Secondary | ICD-10-CM | POA: Diagnosis not present

## 2023-11-08 DIAGNOSIS — K859 Acute pancreatitis without necrosis or infection, unspecified: Secondary | ICD-10-CM | POA: Diagnosis not present

## 2023-11-08 DIAGNOSIS — K2100 Gastro-esophageal reflux disease with esophagitis, without bleeding: Secondary | ICD-10-CM | POA: Diagnosis not present

## 2023-11-08 LAB — CBC
HCT: 34.8 % — ABNORMAL LOW (ref 39.0–52.0)
Hemoglobin: 12.3 g/dL — ABNORMAL LOW (ref 13.0–17.0)
MCH: 31.4 pg (ref 26.0–34.0)
MCHC: 35.3 g/dL (ref 30.0–36.0)
MCV: 88.8 fL (ref 80.0–100.0)
Platelets: 196 10*3/uL (ref 150–400)
RBC: 3.92 MIL/uL — ABNORMAL LOW (ref 4.22–5.81)
RDW: 13 % (ref 11.5–15.5)
WBC: 9.9 10*3/uL (ref 4.0–10.5)
nRBC: 0 % (ref 0.0–0.2)

## 2023-11-08 LAB — COMPREHENSIVE METABOLIC PANEL
ALT: 26 U/L (ref 0–44)
AST: 16 U/L (ref 15–41)
Albumin: 3.6 g/dL (ref 3.5–5.0)
Alkaline Phosphatase: 28 U/L — ABNORMAL LOW (ref 38–126)
Anion gap: 7 (ref 5–15)
BUN: 13 mg/dL (ref 6–20)
CO2: 27 mmol/L (ref 22–32)
Calcium: 8.7 mg/dL — ABNORMAL LOW (ref 8.9–10.3)
Chloride: 103 mmol/L (ref 98–111)
Creatinine, Ser: 0.9 mg/dL (ref 0.61–1.24)
GFR, Estimated: >60 mL/min (ref >60)
Glucose, Bld: 99 mg/dL (ref 70–99)
Potassium: 3.6 mmol/L (ref 3.5–5.1)
Sodium: 137 mmol/L (ref 135–145)
Total Bilirubin: 1.5 mg/dL — ABNORMAL HIGH (ref 0.0–1.2)
Total Protein: 6 g/dL — ABNORMAL LOW (ref 6.5–8.1)

## 2023-11-08 LAB — GLUCOSE, CAPILLARY: Glucose-Capillary: 98 mg/dL (ref 70–99)

## 2023-11-08 NOTE — Discharge Summary (Signed)
 Physician Discharge Summary   Patient: Christopher Savage MRN: [id number] DOB: 1975-10-15  Admit date:     11/06/2023  Discharge date: 11/08/23  Discharge Physician: Amaryllis Dare   PCP: Rudolpho Norleen BIRCH, MD   Recommendations at discharge:  Please obtain CBC and CMP on follow-up Patient need pancreatic protocol MRI as outpatient Follow-up with primary care provider  Discharge Diagnoses: Principal Problem:   Acute on chronic pancreatitis [facility]) Active Problems:   Leukocytosis   GERD (gastroesophageal reflux disease)   Depression   Periumbilical abdominal pain  Resolved Problems:   * No resolved hospital problems. [facility] Course: Taken from H&P.  Christopher Savage is a 48 y.o. male with medical history significant of  alcohol abuse in remission, pancreatitis, depression, who presents abdominal pain, for the past 2 days, progressively worsening, associated with multiple nonbilious nonbloody vomiting.  On presentation vitals stable, labs pertinent for lipase 935, leukocytosis at 12.5, triglyceride 96.  CT abdomen and pelvis with concern of acute on chronic pancreatitis, no evidence of fluid collection, abscess or necrosis.  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 Given the interval increase in size since prior imaging, consideration could be given to nonemergent dedicated pancreatic MRI.  Also had mild diffuse hepatic steatosis and trace free fluid within the abdomen and pelvis.  Patient was admitted for acute on chronic pancreatitis.  2/8: Vitals stable.  Improving leukocytosis at 11.6, lipase improved to 366, T. bili 1.3.  Improving pain and vomiting resolved, starting on clear liquid diet.  2/9: Patient remained hemodynamically stable, nausea, vomiting and pain has been resolved.  Able to tolerate soft diet.   Leukocytosis resolved, mild persistent elevation of T. bili at 1.5, alkaline phosphatase low.  Patient need a pancreatic protocol MRI to rule out any underlying malignant lesion.  Patient apparently follow-up with the [facility] provider who are monitoring his pancreas, he will follow-up with them and have pancreatic MRI done at [facility].  Patient will continue the rest of his home medications and need to have a close follow-up with his providers for further assistance.  Assessment and Plan: * Acute on chronic pancreatitis (HCC) Clinically seems improving with improving lipase.  Symptoms mostly resolved. Patient need a pancreatic protocol MRI as outpatient to rule out any underlying malignant lesion.  Leukocytosis Resolved  GERD (gastroesophageal reflux disease) -Continue Protonix   Depression -Continue home Lexapro   Consultants: None Procedures performed: None Disposition: Home Diet recommendation:  Discharge Diet Orders (From admission, onward)     Start     Ordered   11/08/23 0000  Diet - low sodium heart healthy        11/08/23 1020           Regular diet DISCHARGE MEDICATION: Allergies as of 11/08/2023       Reactions   Sulfa Antibiotics Hives        Medication List     TAKE these medications    albuterol  108 (90 Base) MCG/ACT inhaler Commonly known as: VENTOLIN  HFA Inhale 2 puffs into the lungs every 6 (six) hours as needed for wheezing or shortness of breath.   escitalopram  20 MG tablet Commonly known as: LEXAPRO  Take 1.5 tablets (30 mg total) by mouth daily.   multivitamin with minerals Tabs tablet Take 1 tablet by mouth daily.   omeprazole 20 MG capsule Commonly known as: PRILOSEC Take 20 mg by mouth  daily.   traZODone  50 MG tablet Commonly known as: DESYREL  TAKE 1-2 TABLETS (50-100 MG TOTAL) BY MOUTH AT BEDTIME AS NEEDED FOR SLEEP        Follow-up Information     Rudolpho Norleen BIRCH, MD. Schedule an appointment as soon as possible for a visit in 1  week(s).   Specialty: Internal Medicine Contact information: [address] [facility] [place] [address] [phone]                Discharge Exam: Fredricka Weights   11/06/23 2100  Weight: 103.6 kg   General.  Well-developed gentleman, in no acute distress. Pulmonary.  Lungs clear bilaterally, normal respiratory effort. CV.  Regular rate and rhythm, no JVD, rub or murmur. Abdomen.  Soft, nontender, nondistended, BS positive. CNS.  Alert and oriented .  No focal neurologic deficit. Extremities.  No edema, no cyanosis, pulses intact and symmetrical. Psychiatry.  Judgment and insight appears normal.   Condition at discharge: stable  The results of significant diagnostics from this hospitalization (including imaging, microbiology, ancillary and laboratory) are listed below for reference.   Imaging Studies: CT ABDOMEN PELVIS W CONTRAST Result Date: 11/06/2023 CLINICAL DATA:  Mid abdominal pain for 2 days, history of pancreatitis EXAM: CT ABDOMEN AND PELVIS WITH CONTRAST TECHNIQUE: Multidetector CT imaging of the abdomen and pelvis was performed using the standard protocol following bolus administration of intravenous contrast. RADIATION DOSE REDUCTION: This exam was performed according to the departmental dose-optimization program which includes automated exposure control, adjustment of the mA and/or kV according to patient size and/or use of iterative reconstruction technique. CONTRAST:  OMNIPAQUE  IOHEXOL  350 MG/ML SOLN COMPARISON:  01/21/2021 FINDINGS: Lower chest: No acute pleural or parenchymal lung disease. Hepatobiliary: Mild diffuse hepatic steatosis. No focal liver abnormality. The gallbladder is unremarkable. No biliary duct dilation. Pancreas: Extensive peripancreatic inflammatory changes are noted consistent with acute pancreatitis. Edematous changes are seen within the pancreatic body. No acute fluid collection or abscess. Coarse calcifications are again  seen within the uncinate process of the pancreas consistent sequela of chronic calcific pancreatitis. Lobular hyperdense region within the pancreatic head measuring up to 4.0 x 2.2 cm slightly increased since prior study, corresponding to the suspected chronic inflammatory pseudocyst as reported on prior MRI 08/10/2020 and CT 01/21/2021. No pancreatic duct dilation. Spleen: Normal in size without focal abnormality. Adrenals/Urinary Tract: Adrenal glands are unremarkable. Kidneys are normal, without renal calculi, focal lesion, or hydronephrosis. Bladder is unremarkable. Stomach/Bowel: No bowel obstruction or ileus. Scattered diverticulosis of the descending and sigmoid colon without evidence of acute diverticulitis. Normal appendix right lower quadrant. No bowel wall thickening or inflammatory change. Vascular/Lymphatic: There are no acute vascular abnormalities. Splenic vein, SMV, and portal vein remain patent. No pathologic adenopathy. Reproductive: Prostate is unremarkable. Other: Trace free fluid within the abdomen and pelvis. No free intraperitoneal gas. No abdominal wall hernia. Musculoskeletal: No acute or destructive bony abnormalities. Reconstructed images demonstrate no additional findings. IMPRESSION: 1. Acute on chronic pancreatitis, with no acute fluid collection or abscess identified. No evidence of necrosis. 2. 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 Given the interval increase in size since prior imaging, consideration could be given to nonemergent dedicated pancreatic MRI. 3. Trace free fluid within the abdomen and pelvis, likely reactive. 4. Mild diffuse hepatic steatosis. Electronically Signed   By: Ozell Daring M.D.   On: 11/06/2023 20:15  Microbiology: Results for orders placed or performed during the hospital encounter of 10/03/23  Resp  panel by RT-PCR (RSV, Flu A&B, Covid) Anterior Nasal Swab     Status: Abnormal   Collection Time: 10/03/23  9:49 PM   Specimen: Anterior Nasal Swab  Result Value Ref Range Status   SARS Coronavirus 2 by RT PCR NEGATIVE NEGATIVE Final    Comment: (NOTE) SARS-CoV-2 target nucleic acids are NOT DETECTED.  The SARS-CoV-2 RNA is generally detectable in upper respiratory specimens during the acute phase of infection. The lowest concentration of SARS-CoV-2 viral copies this assay can detect is 138 copies/mL. A negative result does not preclude SARS-Cov-2 infection and should not be used as the sole basis for treatment or other patient management decisions. A negative result may occur with  improper specimen collection/handling, submission of specimen other than nasopharyngeal swab, presence of viral mutation(s) within the areas targeted by this assay, and inadequate number of viral copies(<138 copies/mL). A negative result must be combined with clinical observations, patient history, and epidemiological information. The expected result is Negative.  Fact Sheet for Patients:  bloggercourse.com  Fact Sheet for Healthcare Providers:  seriousbroker.it  This test is no t yet approved or cleared by the [place]  FDA and  has been authorized for detection and/or diagnosis of SARS-CoV-2 by FDA under an Emergency Use Authorization (EUA). This EUA will remain  in effect (meaning this test can be used) for the duration of the COVID-19 declaration under Section 564(b)(1) of the Act, 21 U.S.C.section 360bbb-3(b)(1), unless the authorization is terminated  or revoked sooner.       Influenza A by PCR NEGATIVE NEGATIVE Final   Influenza B by PCR NEGATIVE NEGATIVE Final    Comment: (NOTE) The Xpert Xpress SARS-CoV-2/FLU/RSV plus assay is intended as an aid in the diagnosis of influenza from Nasopharyngeal swab specimens and should not be used as  a sole basis for treatment. Nasal washings and aspirates are unacceptable for Xpert Xpress SARS-CoV-2/FLU/RSV testing.  Fact Sheet for Patients: bloggercourse.com  Fact Sheet for Healthcare Providers: seriousbroker.it  This test is not yet approved or cleared by the [place]  FDA and has been authorized for detection and/or diagnosis of SARS-CoV-2 by FDA under an Emergency Use Authorization (EUA). This EUA will remain in effect (meaning this test can be used) for the duration of the COVID-19 declaration under Section 564(b)(1) of the Act, 21 U.S.C. section 360bbb-3(b)(1), unless the authorization is terminated or revoked.     Resp Syncytial Virus by PCR POSITIVE (A) NEGATIVE Final    Comment: (NOTE) Fact Sheet for Patients: bloggercourse.com  Fact Sheet for Healthcare Providers: seriousbroker.it  This test is not yet approved or cleared by the [place]  FDA and has been authorized for detection and/or diagnosis of SARS-CoV-2 by FDA under an Emergency Use Authorization (EUA). This EUA will remain in effect (meaning this test can be used) for the duration of the COVID-19 declaration under Section 564(b)(1) of the Act, 21 U.S.C. section 360bbb-3(b)(1), unless the authorization is terminated or revoked.  Performed at [facility], [address] [place], [place] [address]     Labs: CBC: Recent Labs  Lab 11/06/23 1650 11/07/23 0437 11/08/23 0438  WBC 12.5* 11.6* 9.9  HGB 15.6 13.7 12.3*  HCT 43.5 38.4* 34.8*  MCV 87.5 88.3 88.8  PLT 246 229 196   Basic Metabolic Panel: Recent Labs  Lab 11/06/23 1650 11/07/23 0437 11/08/23 0438  NA 135 136 137  K 4.0 4.1 3.6  CL 97* 100 103  CO2 25 27 27   GLUCOSE 143* 126* 99  BUN 18 16 13   CREATININE 1.03 0.99 0.90  CALCIUM 9.2 8.7* 8.7*   Liver Function Tests: Recent Labs  Lab 11/06/23 1650  11/07/23 0437 11/08/23 0438  AST 24 19 16   ALT 44 35 26  ALKPHOS 32* 28* 28*  BILITOT 1.5* 1.3* 1.5*  PROT 7.1 6.3* 6.0*  ALBUMIN 4.4 3.9 3.6   CBG: Recent Labs  Lab 11/07/23 0732 11/08/23 0749  GLUCAP 111* 98    Discharge time spent: greater than 30 minutes.  This record has been created using Conservation officer, historic buildings. Errors have been sought and corrected,but may not always be located. Such creation errors do not reflect on the standard of care.   Signed: Amaryllis Dare, MD [facility] 11/08/2023

## 2023-11-08 NOTE — Progress Notes (Addendum)
 0919 Pt tolerated clear liquid diet for breakfast declines any n/v. Per MD verbal orders with readback pt to advance to soft diet  1330 Pt tolerated soft diet for lunch eaten at 1300. No n/v or pain. IV removed pt d/c.

## 2023-11-26 ENCOUNTER — Other Ambulatory Visit: Payer: Self-pay | Admitting: Internal Medicine

## 2023-11-26 DIAGNOSIS — K8689 Other specified diseases of pancreas: Secondary | ICD-10-CM

## 2024-04-15 ENCOUNTER — Other Ambulatory Visit: Payer: Self-pay | Admitting: Internal Medicine

## 2024-04-15 DIAGNOSIS — N5089 Other specified disorders of the male genital organs: Secondary | ICD-10-CM

## 2024-04-18 ENCOUNTER — Other Ambulatory Visit

## 2024-04-21 ENCOUNTER — Ambulatory Visit: Admitting: Urology

## 2024-04-21 ENCOUNTER — Encounter: Payer: Self-pay | Admitting: Urology

## 2024-04-21 VITALS — BP 133/97 | HR 61 | Ht 76.0 in | Wt 223.0 lb

## 2024-04-21 DIAGNOSIS — N4341 Spermatocele of epididymis, single: Secondary | ICD-10-CM | POA: Diagnosis not present

## 2024-04-21 NOTE — Progress Notes (Signed)
 I, Maysun LITTIE Griffiths, acting as a scribe for [name], MD., have documented all relevant documentation on the behalf of [name], MD, as directed by [name], MD while in the presence of [name], MD.  04/21/2024 6:06 PM   Glendia Alan Risden 1976/09/23 [id number]  Referring provider: Rudolpho Norleen BIRCH, MD [address] [facility] [address] [facility] [place],  [place] [address]  Chief Complaint  Patient presents with   Testicle Pain    HPI: Christopher Savage is a 48 y.o. male referred for evaluation and management of scrotal swelling.  Saw Dr Rudolpho a 03/29/24 complaining of right hemiscrotal swelling present for a few days. States he had mild scrotal swelling for several years, but had a significant increase in size for the last 2 months with mild right hemiscrotal discomfort noted. On exam he is felt to have had an enlarged testis, which was non-tender, and was treated for epididymitis. Scrotal sonogram was performed 04/15/24 which showed normal-appearing testes bilaterally. There was a 4.8 x 5.5 x 3.4 cm large right epididymal cyst. Testes were normal appearing bilaterally with normal echo texture.   PMH: Past Medical History:  Diagnosis Date   Depression    Pancreatitis     Surgical History: Past Surgical History:  Procedure Laterality Date   COLONOSCOPY     EUS N/A 10/11/2020   Procedure: FULL UPPER ENDOSCOPIC ULTRASOUND (EUS) RADIAL;  Surgeon: Glena Mt, MD;  Location: [facility] ENDOSCOPY;  Service: Endoscopy;  Laterality: N/A;   UPPER GI ENDOSCOPY      Home Medications:  Allergies as of 04/21/2024       Reactions   Sulfa Antibiotics Hives        Medication List        Accurate as of April 21, 2024  6:06 PM. If you have any questions, ask your nurse or doctor.          STOP taking these medications    albuterol  108 (90 Base) MCG/ACT inhaler Commonly known as: VENTOLIN  HFA Stopped by: [name]       TAKE these medications     escitalopram  20 MG tablet Commonly known as: LEXAPRO  Take 1.5 tablets (30 mg total) by mouth daily.   multivitamin with minerals Tabs tablet Take 1 tablet by mouth daily.   omeprazole 20 MG capsule Commonly known as: PRILOSEC Take 20 mg by mouth daily.   traZODone  50 MG tablet Commonly known as: DESYREL  TAKE 1-2 TABLETS (50-100 MG TOTAL) BY MOUTH AT BEDTIME AS NEEDED FOR SLEEP        Allergies:  Allergies  Allergen Reactions   Sulfa Antibiotics Hives    Family History: Family History  Problem Relation Age of Onset   Hypertension Mother    Mental illness Neg Hx     Social History:  reports that he has never smoked. He has never used smokeless tobacco. He reports that he does not currently use alcohol. He reports that he does not currently use drugs.   Physical Exam: BP (!) 133/97   Pulse 61   Ht 6' 4 (1.93 m)   Wt 223 lb (101.2 kg)   BMI 27.14 kg/m   Constitutional:  Alert and oriented, No acute distress. HEENT: [place] AT Cardiovascular: No clubbing, cyanosis, or edema. Respiratory: Normal respiratory effort, no increased work of breathing. GU: Phallus without lesion. Testes descended bilaterally without masses or tenderness. Large supratesticular cystic mass on the right, consistent with large spermatocele. Psychiatric: Normal  mood and affect.  Pertinent Imaging: Scrotal sonogram images were not available for review. The report was reviewed.   Assessment & Plan:    1. Spermatocele He was reassured there's no concern of malignancy or malignant transformation.  Management options were discussed including observation and spermatocelectomy. We discussed the high recurrence rate of aspiration. He would like to proceed with surgical removal. The procedure was discussed including potential risks of bleeding/hematoma and infection/abscess, either which could require repeat surgery for drainage.  Both possible need for a post-operative drain was discussed. We also  discussed the formation of a spermatocele in a different portion of the epididymis could occur, but less likely.  All questions were answered, and he desires to proceed.  I have reviewed the above documentation for accuracy and completeness, and I agree with the above.   [name], MD  [facility] [address], [address] [place], [place] [address] [phone]

## 2024-04-21 NOTE — H&P (View-Only) (Signed)
 I, Maysun LITTIE Griffiths, acting as a scribe for [name], MD., have documented all relevant documentation on the behalf of [name], MD, as directed by [name], MD while in the presence of [name], MD.  04/21/2024 6:06 PM   Glendia Alan Mcgowen 05-10-1976 [id number]  Referring provider: Rudolpho Norleen BIRCH, MD [address] [facility] [address] [facility] [place],  [place] [address]  Chief Complaint  Patient presents with   Testicle Pain    HPI: Christopher Savage is a 48 y.o. male referred for evaluation and management of scrotal swelling.  Saw Dr Rudolpho a 03/29/24 complaining of right hemiscrotal swelling present for a few days. States he had mild scrotal swelling for several years, but had a significant increase in size for the last 2 months with mild right hemiscrotal discomfort noted. On exam he is felt to have had an enlarged testis, which was non-tender, and was treated for epididymitis. Scrotal sonogram was performed 04/15/24 which showed normal-appearing testes bilaterally. There was a 4.8 x 5.5 x 3.4 cm large right epididymal cyst. Testes were normal appearing bilaterally with normal echo texture.   PMH: Past Medical History:  Diagnosis Date   Depression    Pancreatitis     Surgical History: Past Surgical History:  Procedure Laterality Date   COLONOSCOPY     EUS N/A 10/11/2020   Procedure: FULL UPPER ENDOSCOPIC ULTRASOUND (EUS) RADIAL;  Surgeon: Glena Mt, MD;  Location: [facility] ENDOSCOPY;  Service: Endoscopy;  Laterality: N/A;   UPPER GI ENDOSCOPY      Home Medications:  Allergies as of 04/21/2024       Reactions   Sulfa Antibiotics Hives        Medication List        Accurate as of April 21, 2024  6:06 PM. If you have any questions, ask your nurse or doctor.          STOP taking these medications    albuterol  108 (90 Base) MCG/ACT inhaler Commonly known as: VENTOLIN  HFA Stopped by: [name]       TAKE these medications     escitalopram  20 MG tablet Commonly known as: LEXAPRO  Take 1.5 tablets (30 mg total) by mouth daily.   multivitamin with minerals Tabs tablet Take 1 tablet by mouth daily.   omeprazole 20 MG capsule Commonly known as: PRILOSEC Take 20 mg by mouth daily.   traZODone  50 MG tablet Commonly known as: DESYREL  TAKE 1-2 TABLETS (50-100 MG TOTAL) BY MOUTH AT BEDTIME AS NEEDED FOR SLEEP        Allergies:  Allergies  Allergen Reactions   Sulfa Antibiotics Hives    Family History: Family History  Problem Relation Age of Onset   Hypertension Mother    Mental illness Neg Hx     Social History:  reports that he has never smoked. He has never used smokeless tobacco. He reports that he does not currently use alcohol. He reports that he does not currently use drugs.   Physical Exam: BP (!) 133/97   Pulse 61   Ht 6' 4 (1.93 m)   Wt 223 lb (101.2 kg)   BMI 27.14 kg/m   Constitutional:  Alert and oriented, No acute distress. HEENT: [place] AT Cardiovascular: No clubbing, cyanosis, or edema. Respiratory: Normal respiratory effort, no increased work of breathing. GU: Phallus without lesion. Testes descended bilaterally without masses or tenderness. Large supratesticular cystic mass on the right, consistent with large spermatocele. Psychiatric: Normal  mood and affect.  Pertinent Imaging: Scrotal sonogram images were not available for review. The report was reviewed.   Assessment & Plan:    1. Spermatocele He was reassured there's no concern of malignancy or malignant transformation.  Management options were discussed including observation and spermatocelectomy. We discussed the high recurrence rate of aspiration. He would like to proceed with surgical removal. The procedure was discussed including potential risks of bleeding/hematoma and infection/abscess, either which could require repeat surgery for drainage.  Both possible need for a post-operative drain was discussed. We also  discussed the formation of a spermatocele in a different portion of the epididymis could occur, but less likely.  All questions were answered, and he desires to proceed.  I have reviewed the above documentation for accuracy and completeness, and I agree with the above.   [name], MD  [facility] [address] [place], [place] [address] [phone]

## 2024-04-26 ENCOUNTER — Other Ambulatory Visit: Payer: Self-pay

## 2024-04-26 ENCOUNTER — Telehealth: Payer: Self-pay

## 2024-04-26 DIAGNOSIS — N4341 Spermatocele of epididymis, single: Secondary | ICD-10-CM

## 2024-04-26 NOTE — Progress Notes (Signed)
   [facility] Surgical Posting Form  Surgery Date: Date: 05/12/2024  Surgeon: Dr. Glendia Barba, MD  Inpt ( No  )   Outpt (Yes)   Obs ( No  )   Diagnosis: N43.41 Right Spermatocele  -CPT: [phone]  Surgery: Right Spermatocelectomy  Stop Anticoagulations: Yes  Cardiac/Medical/Pulmonary Clearance needed: No  *Orders entered into EPIC  Date: 04/26/24   *Case booked in EPIC  Date: 04/26/24  *Notified pt of Surgery: Date: 04/26/24  PRE-OP UA & CX: no  *Placed into Prior Authorization Work [place] Date: 04/26/24  Assistant/laser/rep:No

## 2024-04-26 NOTE — Progress Notes (Signed)
 Surgical Physician Order Form [facility]  Dr. Glendia Barba, MD  * Scheduling expectation : Patient preference  *Length of Case: 60 minutes  *Clearance needed: no  *Anticoagulation Instructions: N/A  *Aspirin Instructions: N/A  *Post-op visit Date/Instructions:  1-3 day drain removal  *Diagnosis: Right spermatocele  *Procedure: Right spermatocelectomy   Additional orders: N/A  -Admit type: OUTpatient  -Anesthesia: Choice  -VTE Prophylaxis Standing Order SCD's       Other:   -Standing Lab Orders Per Anesthesia    Lab other: None  -Standing Test orders EKG/Chest x-ray per Anesthesia       Test other:   - Medications:  Ancef 2gm IV  -Other orders:  N/A

## 2024-04-26 NOTE — Telephone Encounter (Signed)
 Per Dr. Twylla, Patient is to be scheduled for Right Spermatocelectomy   Christopher Savage was contacted and possible surgical dates were discussed, Thursday August 14th, 2025 was agreed upon for surgery.   Patient was directed to call [phone] between 1-3pm the day before surgery to find out surgical arrival time.  Instructions were given not to eat or drink from midnight on the night before surgery and have a driver for the day of surgery. On the surgery day patient was instructed to enter through the [facility] entrance of [facility] report the Same Day Surgery desk.   Pre-Admit Testing will be in contact via phone to set up an interview with the anesthesia team to review your history and medications prior to surgery.   Reminder of this information was sent via MyChart to the patient.

## 2024-05-06 ENCOUNTER — Inpatient Hospital Stay
Admission: RE | Admit: 2024-05-06 | Discharge: 2024-05-06 | Disposition: A | Discharge status: Home / Self Care | Source: Ambulatory Visit

## 2024-05-06 HISTORY — DX: Hyperlipidemia, unspecified: E78.5

## 2024-05-06 HISTORY — DX: Alcohol abuse, uncomplicated: F10.10

## 2024-05-06 HISTORY — DX: Gastro-esophageal reflux disease without esophagitis: K21.9

## 2024-05-06 HISTORY — DX: Anemia, unspecified: D64.9

## 2024-05-06 HISTORY — DX: Spermatocele of epididymis, unspecified: N43.40

## 2024-05-06 HISTORY — DX: Fatty (change of) liver, not elsewhere classified: K76.0

## 2024-05-06 NOTE — Patient Instructions (Signed)
 Your procedure is scheduled on:05-12-24 Thursday Report to the Registration Desk on the 1st floor of the Medical Mall.Then proceed to the 2nd floor Surgery Desk To find out your arrival time, please call [phone] between 1PM - 3PM on:05-11-24 Wednesday If your arrival time is 6:00 am, do not arrive before that time as the Medical Mall entrance doors do not open until 6:00 am.  REMEMBER: Instructions that are not followed completely may result in serious medical risk, up to and including death; or upon the discretion of your surgeon and anesthesiologist your surgery may need to be rescheduled.  Do not eat food OR drink liquids after midnight the night before surgery.  No gum chewing or hard candies.  One week prior to surgery:Stop NOW (05-06-24) Stop Anti-inflammatories (NSAIDS) such as Advil, Aleve, Ibuprofen, Motrin, Naproxen, Naprosyn and Aspirin based products such as Excedrin, Goody's Powder, BC Powder. Stop ANY OVER THE COUNTER supplements until after surgery (Multivitamin)  You may however, continue to take Tylenol  if needed for pain up until the day of surgery.  Continue taking all of your other prescription medications up until the day of surgery.  ON THE DAY OF SURGERY ONLY TAKE THESE MEDICATIONS WITH SIPS OF WATER: -escitalopram  (LEXAPRO )  -omeprazole (PRILOSEC)   No Alcohol for 24 hours before or after surgery.  No Smoking including e-cigarettes for 24 hours before surgery.  No chewable tobacco products for at least 6 hours before surgery.  No nicotine patches on the day of surgery.  Do not use any recreational drugs for at least a week (preferably 2 weeks) before your surgery.  Please be advised that the combination of cocaine and anesthesia may have negative outcomes, up to and including death. If you test positive for cocaine, your surgery will be cancelled.  On the morning of surgery brush your teeth with toothpaste and water, you may rinse your mouth with  mouthwash if you wish. Do not swallow any toothpaste or mouthwash.  Do not wear jewelry, make-up, hairpins, clips or nail polish.  For welded (permanent) jewelry: bracelets, anklets, waist bands, etc.  Please have this removed prior to surgery.  If it is not removed, there is a chance that hospital personnel will need to cut it off on the day of surgery.  Do not wear lotions, powders, or perfumes.   Do not shave body hair from the neck down 48 hours before surgery.  Contact lenses, hearing aids and dentures may not be worn into surgery.  Do not bring valuables to the hospital. [facility] is not responsible for any missing/lost belongings or valuables.  Notify your doctor if there is any change in your medical condition (cold, fever, infection).  Wear comfortable clothing (specific to your surgery type) to the hospital.  After surgery, you can help prevent lung complications by doing breathing exercises.  Take deep breaths and cough every 1-2 hours. Your doctor may order a device called an Incentive Spirometer to help you take deep breaths. When coughing or sneezing, hold a pillow firmly against your incision with both hands. This is called "splinting." Doing this helps protect your incision. It also decreases belly discomfort.  If you are being admitted to the hospital overnight, leave your suitcase in the car. After surgery it may be brought to your room.  In case of increased patient census, it may be necessary for you, the patient, to continue your postoperative care in the Same Day Surgery department.  If you are being discharged the day of  surgery, you will not be allowed to drive home. You will need a responsible individual to drive you home and stay with you for 24 hours after surgery.   If you are taking public transportation, you will need to have a responsible individual with you.  Please call the Pre-admissions Testing Dept. at [phone] if you have any questions  about these instructions.  Surgery Visitation Policy:  Patients having surgery or a procedure may have two visitors.  Children under the age of 78 must have an adult with them who is not the patient.   Merchandiser, retail to address health-related social needs:  [email or web address]

## 2024-05-09 ENCOUNTER — Inpatient Hospital Stay
Admission: RE | Admit: 2024-05-09 | Discharge: 2024-05-09 | Disposition: A | Discharge status: Home / Self Care | Source: Ambulatory Visit

## 2024-05-09 HISTORY — DX: Elevated white blood cell count, unspecified: D72.829

## 2024-05-09 HISTORY — DX: Essential (primary) hypertension: I10

## 2024-05-09 NOTE — Progress Notes (Addendum)
 Left multiple messages for pt on Friday 8-8 and again today 8-11 to compete anesthesia interview with no call back. Reached out to [facility] surgery scheduler to inform her of this. I informed her that pt needs labs and EKG. I asked if she could reach out to see if she can get in touch with pt. Melissa reached out to pt and had to leave a message as well. I told her that if pt did not call us  back we would not be reaching back out to pt.

## 2024-05-11 ENCOUNTER — Inpatient Hospital Stay: Admission: RE | Admit: 2024-05-11 | Source: Ambulatory Visit

## 2024-05-11 ENCOUNTER — Inpatient Hospital Stay
Admission: RE | Admit: 2024-05-11 | Discharge: 2024-05-11 | Disposition: A | Discharge status: Home / Self Care | Source: Ambulatory Visit

## 2024-05-11 MED ORDER — CEFAZOLIN SODIUM-DEXTROSE 2-4 GM/100ML-% IV SOLN
2.0000 g | INTRAVENOUS | Status: AC
  Administered 2024-05-12: 2 g via INTRAVENOUS

## 2024-05-11 MED ORDER — LACTATED RINGERS IV SOLN: INTRAVENOUS | Status: DC

## 2024-05-11 MED ORDER — CHLORHEXIDINE GLUCONATE 0.12 % MT SOLN
15.0000 mL | Freq: Once | OROMUCOSAL | Status: AC
  Administered 2024-05-12: 15 mL via OROMUCOSAL

## 2024-05-11 MED ORDER — ORAL CARE MOUTH RINSE: 15.0000 mL | Freq: Once | OROMUCOSAL | Status: AC

## 2024-05-11 NOTE — Pre-Procedure Instructions (Signed)
 PAT appointment scheduled for today, I have made multiple attempts to reach patient, left x 2 VM with no return call. Patient is aware of his arrival time.

## 2024-05-11 NOTE — Patient Instructions (Addendum)
 Your procedure is scheduled on: 05/12/24  Report to the Registration Desk on the 1st floor of the Medical [facility]. To find out your arrival time, please call [phone] between 1PM - 3PM on: 05/11/24 If your arrival time is 6:00 am, do not arrive before that time as the Medical Mall entrance doors do not open until 6:00 am.  REMEMBER: Instructions that are not followed completely may result in serious medical risk, up to and including death; or upon the discretion of your surgeon and anesthesiologist your surgery may need to be rescheduled.  Do not eat food or drink any liquids after midnight the night before surgery.  No gum chewing or hard candies.  One week prior to surgery: Stop Anti-inflammatories (NSAIDS) such as Advil, Aleve, Ibuprofen, Motrin, Naproxen, Naprosyn and Aspirin based products such as Excedrin, Goody's Powder, BC Powder. You may take Tylenol  if needed for pain up until the day of surgery.  Stop ANY OVER THE COUNTER supplements until after surgery : Multiple Vitamin    ON THE DAY OF SURGERY ONLY TAKE THESE MEDICATIONS WITH SIPS OF WATER:  escitalopram  (LEXAPRO )  omeprazole (PRILOSEC)   No Alcohol for 24 hours before or after surgery.  No Smoking including e-cigarettes for 24 hours before surgery.  No chewable tobacco products for at least 6 hours before surgery.  No nicotine patches on the day of surgery.  Do not use any recreational drugs for at least a week (preferably 2 weeks) before your surgery.  Please be advised that the combination of cocaine and anesthesia may have negative outcomes, up to and including death. If you test positive for cocaine, your surgery will be cancelled.  On the morning of surgery brush your teeth with toothpaste and water, you may rinse your mouth with mouthwash if you wish. Do not swallow any toothpaste or mouthwash.  Use CHG Soap or wipes as directed on instruction sheet.  Do not wear jewelry, make-up, hairpins, clips or nail  polish.  For welded (permanent) jewelry: bracelets, anklets, waist bands, etc.  Please have this removed prior to surgery.  If it is not removed, there is a chance that hospital personnel will need to cut it off on the day of surgery.  Do not wear lotions, powders, or perfumes.   Do not shave body hair from the neck down 48 hours before surgery.  Contact lenses, hearing aids and dentures may not be worn into surgery.  Do not bring valuables to the hospital. [facility] is not responsible for any missing/lost belongings or valuables.   Notify your doctor if there is any change in your medical condition (cold, fever, infection).  Wear comfortable clothing (specific to your surgery type) to the hospital.  After surgery, you can help prevent lung complications by doing breathing exercises.  Take deep breaths and cough every 1-2 hours. Your doctor may order a device called an Incentive Spirometer to help you take deep breaths.  When coughing or sneezing, hold a pillow firmly against your incision with both hands. This is called "splinting." Doing this helps protect your incision. It also decreases belly discomfort.  If you are being admitted to the hospital overnight, leave your suitcase in the car. After surgery it may be brought to your room.  In case of increased patient census, it may be necessary for you, the patient, to continue your postoperative care in the Same Day Surgery department.  If you are being discharged the day of surgery, you will not be allowed to  drive home. You will need a responsible individual to drive you home and stay with you for 24 hours after surgery.   If you are taking public transportation, you will need to have a responsible individual with you.  Please call the Pre-admissions Testing Dept. at [phone] if you have any questions about these instructions.  Surgery Visitation Policy:  Patients having surgery or a procedure may have two visitors.   Children under the age of 60 must have an adult with them who is not the patient.  Inpatient Visitation:    Visiting hours are 7 a.m. to 8 p.m. Up to four visitors are allowed at one time in a patient room. The visitors may rotate out with other people during the day.  One visitor age 21 or older may stay with the patient overnight and must be in the room by 8 p.m.   Merchandiser, retail to address health-related social needs:  [email or web address]

## 2024-05-12 ENCOUNTER — Ambulatory Visit: Payer: Self-pay | Admitting: Urgent Care

## 2024-05-12 ENCOUNTER — Ambulatory Visit: Admitting: Certified Registered"

## 2024-05-12 ENCOUNTER — Encounter: Payer: Self-pay | Admitting: Urology

## 2024-05-12 ENCOUNTER — Other Ambulatory Visit: Payer: Self-pay

## 2024-05-12 ENCOUNTER — Ambulatory Visit
Admission: RE | Admit: 2024-05-12 | Discharge: 2024-05-12 | Disposition: A | Discharge status: Home / Self Care | Attending: Urology | Admitting: Urology

## 2024-05-12 ENCOUNTER — Encounter
Admission: RE | Disposition: A | Discharge status: Home / Self Care | Payer: Self-pay | Source: Home / Self Care | Attending: Urology

## 2024-05-12 DIAGNOSIS — D649 Anemia, unspecified: Secondary | ICD-10-CM | POA: Diagnosis not present

## 2024-05-12 DIAGNOSIS — I10 Essential (primary) hypertension: Secondary | ICD-10-CM | POA: Diagnosis not present

## 2024-05-12 DIAGNOSIS — F32A Depression, unspecified: Secondary | ICD-10-CM | POA: Diagnosis not present

## 2024-05-12 DIAGNOSIS — K219 Gastro-esophageal reflux disease without esophagitis: Secondary | ICD-10-CM | POA: Diagnosis not present

## 2024-05-12 DIAGNOSIS — N4341 Spermatocele of epididymis, single: Secondary | ICD-10-CM

## 2024-05-12 HISTORY — PX: SPERMATOCELECTOMY: SHX2420

## 2024-05-12 SURGERY — EXCISION, SPERMATOCELE
Anesthesia: General | Site: Scrotum | Laterality: Right

## 2024-05-12 MED ORDER — DROPERIDOL 2.5 MG/ML IJ SOLN: 0.6250 mg | Freq: Once | INTRAMUSCULAR | Status: DC | PRN

## 2024-05-12 MED ORDER — FENTANYL CITRATE (PF) 100 MCG/2ML IJ SOLN
INTRAMUSCULAR | Status: AC
  Filled 2024-05-12: qty 2

## 2024-05-12 MED ORDER — KETOROLAC TROMETHAMINE 30 MG/ML IJ SOLN
INTRAMUSCULAR | Status: DC | PRN
  Administered 2024-05-12: 30 mg via INTRAVENOUS

## 2024-05-12 MED ORDER — HYDROCODONE-ACETAMINOPHEN 5-325 MG PO TABS: 1.0000 | ORAL_TABLET | Freq: Four times a day (QID) | ORAL | 0 refills | Status: AC | PRN

## 2024-05-12 MED ORDER — DEXAMETHASONE SODIUM PHOSPHATE 10 MG/ML IJ SOLN
INTRAMUSCULAR | Status: DC | PRN
Start: 2024-05-12 — End: 2024-05-12
  Administered 2024-05-12: 10 mg via INTRAVENOUS

## 2024-05-12 MED ORDER — CHLORHEXIDINE GLUCONATE 0.12 % MT SOLN
OROMUCOSAL | Status: AC
  Filled 2024-05-12: qty 15

## 2024-05-12 MED ORDER — DOUBLE ANTIBIOTIC 500-10000 UNIT/GM EX OINT
TOPICAL_OINTMENT | CUTANEOUS | Status: DC | PRN
  Administered 2024-05-12: 1 via TOPICAL

## 2024-05-12 MED ORDER — OXYCODONE HCL 5 MG/5ML PO SOLN: 5.0000 mg | Freq: Once | ORAL | Status: AC | PRN

## 2024-05-12 MED ORDER — FENTANYL CITRATE (PF) 100 MCG/2ML IJ SOLN
INTRAMUSCULAR | Status: DC | PRN
  Administered 2024-05-12: 25 ug via INTRAVENOUS
  Administered 2024-05-12 (×2): 50 ug via INTRAVENOUS
  Administered 2024-05-12: 25 ug via INTRAVENOUS
  Administered 2024-05-12: 50 ug via INTRAVENOUS

## 2024-05-12 MED ORDER — BUPIVACAINE HCL (PF) 0.25 % IJ SOLN
INTRAMUSCULAR | Status: AC
  Filled 2024-05-12: qty 30

## 2024-05-12 MED ORDER — EPHEDRINE SULFATE-NACL 50-0.9 MG/10ML-% IV SOSY
PREFILLED_SYRINGE | INTRAVENOUS | Status: DC | PRN
  Administered 2024-05-12: 5 mg via INTRAVENOUS

## 2024-05-12 MED ORDER — MIDAZOLAM HCL 2 MG/2ML IJ SOLN
INTRAMUSCULAR | Status: DC | PRN
  Administered 2024-05-12: 2 mg via INTRAVENOUS

## 2024-05-12 MED ORDER — FENTANYL CITRATE (PF) 100 MCG/2ML IJ SOLN: 25.0000 ug | INTRAMUSCULAR | Status: DC | PRN

## 2024-05-12 MED ORDER — OXYCODONE HCL 5 MG PO TABS
ORAL_TABLET | ORAL | Status: AC
  Filled 2024-05-12: qty 1

## 2024-05-12 MED ORDER — DEXMEDETOMIDINE HCL IN NACL 200 MCG/50ML IV SOLN
INTRAVENOUS | Status: DC | PRN
Start: 2024-05-12 — End: 2024-05-12
  Administered 2024-05-12: 12 ug via INTRAVENOUS

## 2024-05-12 MED ORDER — OXYCODONE HCL 5 MG PO TABS
5.0000 mg | ORAL_TABLET | Freq: Once | ORAL | Status: AC | PRN
  Administered 2024-05-12: 5 mg via ORAL

## 2024-05-12 MED ORDER — LIDOCAINE HCL (CARDIAC) PF 100 MG/5ML IV SOSY
PREFILLED_SYRINGE | INTRAVENOUS | Status: DC | PRN
  Administered 2024-05-12: 100 mg via INTRAVENOUS

## 2024-05-12 MED ORDER — PHENYLEPHRINE 80 MCG/ML (10ML) SYRINGE FOR IV PUSH (FOR BLOOD PRESSURE SUPPORT)
PREFILLED_SYRINGE | INTRAVENOUS | Status: DC | PRN
  Administered 2024-05-12 (×2): 160 ug via INTRAVENOUS

## 2024-05-12 MED ORDER — GLYCOPYRROLATE 0.2 MG/ML IJ SOLN
INTRAMUSCULAR | Status: DC | PRN
  Administered 2024-05-12: .2 mg via INTRAVENOUS

## 2024-05-12 MED ORDER — ACETAMINOPHEN 10 MG/ML IV SOLN
INTRAVENOUS | Status: DC | PRN
Start: 2024-05-12 — End: 2024-05-12
  Administered 2024-05-12: 1000 mg via INTRAVENOUS

## 2024-05-12 MED ORDER — CEFAZOLIN SODIUM-DEXTROSE 2-4 GM/100ML-% IV SOLN
INTRAVENOUS | Status: AC
  Filled 2024-05-12: qty 100

## 2024-05-12 MED ORDER — 0.9 % SODIUM CHLORIDE (POUR BTL) OPTIME
TOPICAL | Status: DC | PRN
  Administered 2024-05-12: 500 mL

## 2024-05-12 MED ORDER — ACETAMINOPHEN 10 MG/ML IV SOLN
INTRAVENOUS | Status: AC
  Filled 2024-05-12: qty 100

## 2024-05-12 MED ORDER — BUPIVACAINE HCL 0.25 % IJ SOLN
INTRAMUSCULAR | Status: DC | PRN
  Administered 2024-05-12: 5 mL

## 2024-05-12 MED ORDER — ONDANSETRON HCL 4 MG/2ML IJ SOLN
INTRAMUSCULAR | Status: DC | PRN
  Administered 2024-05-12 (×2): 4 mg via INTRAVENOUS

## 2024-05-12 MED ORDER — PROPOFOL 10 MG/ML IV BOLUS
INTRAVENOUS | Status: DC | PRN
  Administered 2024-05-12: 200 mg via INTRAVENOUS

## 2024-05-12 MED ORDER — BACITRACIN ZINC 500 UNIT/GM EX OINT
TOPICAL_OINTMENT | CUTANEOUS | Status: AC
  Filled 2024-05-12: qty 28.35

## 2024-05-12 MED ORDER — MIDAZOLAM HCL 2 MG/2ML IJ SOLN
INTRAMUSCULAR | Status: AC
  Filled 2024-05-12: qty 2

## 2024-05-12 MED ORDER — ACETAMINOPHEN 10 MG/ML IV SOLN: 1000.0000 mg | Freq: Once | INTRAVENOUS | Status: DC | PRN

## 2024-05-12 SURGICAL SUPPLY — 25 items
BLADE SURG 15 STRL LF DISP TIS (BLADE) ×1 IMPLANT
BNDG CONFORM 6X.82 1P STRL (GAUZE/BANDAGES/DRESSINGS) IMPLANT
CHLORAPREP W/TINT 26 (MISCELLANEOUS) ×1 IMPLANT
DRAIN PENROSE 0.625X18 (DRAIN) ×1 IMPLANT
DRAPE LAPAROTOMY 77X122 PED (DRAPES) ×1 IMPLANT
DRSG TELFA 4X3 1S NADH ST (GAUZE/BANDAGES/DRESSINGS) IMPLANT
ELECT BLADE 6.5 EXT (BLADE) ×1 IMPLANT
ELECTRODE REM PT RTRN 9FT ADLT (ELECTROSURGICAL) ×1 IMPLANT
GAUZE SPONGE 4X4 12PLY STRL (GAUZE/BANDAGES/DRESSINGS) ×1 IMPLANT
GLOVE BIOGEL PI IND STRL 7.5 (GLOVE) ×1 IMPLANT
GOWN STRL REUS W/ TWL LRG LVL3 (GOWN DISPOSABLE) ×1 IMPLANT
GOWN STRL REUS W/ TWL XL LVL3 (GOWN DISPOSABLE) ×1 IMPLANT
KIT TURNOVER KIT A (KITS) ×1 IMPLANT
MANIFOLD NEPTUNE II (INSTRUMENTS) ×1 IMPLANT
NDL HYPO 25X1 1.5 SAFETY (NEEDLE) ×1 IMPLANT
NEEDLE HYPO 25X1 1.5 SAFETY (NEEDLE) ×1 IMPLANT
NS IRRIG 500ML POUR BTL (IV SOLUTION) IMPLANT
PACK BASIN MINOR ARMC (MISCELLANEOUS) ×1 IMPLANT
SUPPORETR ATHLETIC LG (MISCELLANEOUS) ×1 IMPLANT
SUPPORTER AHLETIC TETRA LG (SOFTGOODS) IMPLANT
SUT CHROMIC 3 0 PS 2 (SUTURE) ×1 IMPLANT
SUT MON AB 3-0 SH27 (SUTURE) IMPLANT
SUT SILK 0 CT 1 30 (SUTURE) IMPLANT
SUT VIC AB 3-0 SH 27X BRD (SUTURE) ×2 IMPLANT
TRAP FLUID SMOKE EVACUATOR (MISCELLANEOUS) ×1 IMPLANT

## 2024-05-12 NOTE — Anesthesia Procedure Notes (Signed)
 Procedure Name: LMA Insertion Date/Time: 05/12/2024 2:33 PM  Performed by: Ledora Duncan, CRNAPre-anesthesia Checklist: Patient identified, Emergency Drugs available, Suction available and Patient being monitored Patient Re-evaluated:Patient Re-evaluated prior to induction Oxygen Delivery Method: Circle system utilized Preoxygenation: Pre-oxygenation with 100% oxygen Induction Type: IV induction LMA: LMA inserted LMA Size: 4.0 Placement Confirmation: positive ETCO2 and breath sounds checked- equal and bilateral Dental Injury: Teeth and Oropharynx as per pre-operative assessment

## 2024-05-12 NOTE — Op Note (Signed)
    Preoperative diagnosis:  Right spermatocele  Postoperative diagnosis:  Right spermatocele  Procedure: Right spermatocelectomy  Surgeon: [name], MD  Anesthesia: General  Complications: None  Intraoperative findings:  Large spermatocele, globus major  EBL: < 5cc  Specimens: Spermatocele  Indication: Christopher Savage is a 48 y.o. male with a 4.8 x 5.5 x 3.4 cm right spermatocele.  Refer to H&P for details.  After reviewing the management options for treatment, he elected to proceed with the above surgical procedure(s). We have discussed the potential benefits and risks of the procedure, side effects of the proposed treatment, the likelihood of the patient achieving the goals of the procedure, and any potential problems that might occur during the procedure or recuperation. Informed consent has been obtained.  Description of procedure:  The patient was taken to the operating room and general anesthesia was induced.  The patient was placed in the supine position, prepped and draped in the usual sterile fashion, and preoperative antibiotics were administered. A preoperative time-out was performed.   The large spermatocele was elevated to the anterior aspect of the right hemiscrotum and a 4 cm transverse incision was made over the cyst.  The incision was carried through dartos fascia with cautery.  Once the cyst was visualized additional layers were incised after developing with Metzenbaum scissors and incising with cautery.  As these layers were developed superiorly and working inferiorly the right testis was identified and delivered into the operative field.  The entire cyst was then isolated down to its connection to the epididymis.  The tunica vaginalis was was opened and incised with cautery ~1 cm away from the epididymis.  The cyst was developed down to an attachment noted at the junction of the testis/epididymal connection.  This was developed with hemostats, clamped and  suture-ligated with 3-0 Vicryl suture.  Small oozing sites were controlled with cautery and once hemostasis was noted to be excellent the testis/epididymis were irrigated and placed back into the right hemiscrotum in its anatomic position.  No bleeding was noted from the scrotal walls.  The incision was anesthetized with 5 mL of 0.5% plain bupivacaine .  The dartos was closed with a running 3-0 Vicryl suture.  The skin was closed with a running horizontal mattress suture of 3-0 Monocryl.  A dressing of bacitracin , Telfa and a scrotal turban dressing using Conform was placed.  A scrotal support was then applied.  Plan: Postop follow-up will be scheduled in ~1 month   [name], M.D.

## 2024-05-12 NOTE — Anesthesia Preprocedure Evaluation (Addendum)
 Anesthesia Evaluation  Patient identified by MRN, date of birth, ID band Patient awake    Reviewed: Allergy & Precautions, H&P , NPO status , Patient's Chart, lab work & pertinent test results  Airway Mallampati: III  TM Distance: >3 FB Neck ROM: full    Dental no notable dental hx.    Pulmonary neg pulmonary ROS   Pulmonary exam normal        Cardiovascular hypertension, Normal cardiovascular exam     Neuro/Psych  PSYCHIATRIC DISORDERS  Depression    negative neurological ROS     GI/Hepatic Neg liver ROS,GERD  Controlled,,  Endo/Other  negative endocrine ROS    Renal/GU      Musculoskeletal   Abdominal Normal abdominal exam  (+)   Peds  Hematology  (+) Blood dyscrasia, anemia   Anesthesia Other Findings Past Medical History: No date: Alcohol abuse     Comment:  sober since 2019 No date: Anemia No date: Depression No date: GERD (gastroesophageal reflux disease) No date: Hepatic steatosis No date: Hyperlipidemia No date: Hypertension No date: Leukocytosis No date: Pancreatitis No date: Spermatocele  Past Surgical History: No date: COLONOSCOPY 10/11/2020: EUS; N/A     Comment:  Procedure: FULL UPPER ENDOSCOPIC ULTRASOUND (EUS)               RADIAL;  Surgeon: Glena Mt, MD;  Location: [facility]               ENDOSCOPY;  Service: Endoscopy;  Laterality: N/A; No date: UPPER GI ENDOSCOPY  BMI    Body Mass Index: 26.78 kg/m      Reproductive/Obstetrics negative OB ROS                              Anesthesia Physical Anesthesia Plan  ASA: 2  Anesthesia Plan: General LMA   Post-op Pain Management: Ofirmev  IV (intra-op)*, Regional block* and Toradol  IV (intra-op)*   Induction: Intravenous  PONV Risk Score and Plan: Dexamethasone , Ondansetron , Midazolam  and Treatment may vary due to age or medical condition  Airway Management Planned: LMA  Additional Equipment:   Intra-op  Plan:   Post-operative Plan: Extubation in OR  Informed Consent: I have reviewed the patients History and Physical, chart, labs and discussed the procedure including the risks, benefits and alternatives for the proposed anesthesia with the patient or authorized representative who has indicated his/her understanding and acceptance.     Dental Advisory Given  Plan Discussed with: Anesthesiologist, CRNA and Surgeon  Anesthesia Plan Comments:          Anesthesia Quick Evaluation

## 2024-05-12 NOTE — Transfer of Care (Signed)
 Immediate Anesthesia Transfer of Care Note  Patient: Christopher Savage  Procedure(s) Performed: EXCISION, SPERMATOCELE (Right: Scrotum)  Patient Location: PACU  Anesthesia Type:General  Level of Consciousness: awake, drowsy, and patient cooperative  Airway & Oxygen Therapy: Patient Spontanous Breathing  Post-op Assessment: Report given to RN and Post -op Vital signs reviewed and stable  Post vital signs: Reviewed and stable  Last Vitals:  Vitals Value Taken Time  BP 144/95 05/12/24 15:50  Temp 36.6 C 05/12/24 15:50  Pulse 84 05/12/24 15:54  Resp 13 05/12/24 15:54  SpO2 97 % 05/12/24 15:54  Vitals shown include unfiled device data.  Last Pain:  Vitals:   05/12/24 1550  TempSrc:   PainSc: 2          Complications: No notable events documented.

## 2024-05-12 NOTE — Interval H&P Note (Signed)
 History and Physical Interval Note:  05/12/2024 2:33 PM  Christopher Savage  has presented today for surgery, with the diagnosis of Right Spermatocele.  The various methods of treatment have been discussed with the patient and family. After consideration of risks, benefits and other options for treatment, the patient has consented to  Procedure(s): EXCISION, SPERMATOCELE (Right) as a surgical intervention.  The patient's history has been reviewed, patient examined, no change in status, stable for surgery.  I have reviewed the patient's chart and labs.  Questions were answered to the patient's satisfaction.    CV:RRR Lungs:clear  [name]

## 2024-05-12 NOTE — Anesthesia Postprocedure Evaluation (Signed)
 Anesthesia Post Note  Patient: Christopher Savage  Procedure(s) Performed: EXCISION, SPERMATOCELE (Right: Scrotum)  Patient location during evaluation: PACU Anesthesia Type: General Level of consciousness: awake and alert Pain management: pain level controlled Vital Signs Assessment: post-procedure vital signs reviewed and stable Respiratory status: spontaneous breathing, nonlabored ventilation, respiratory function stable and patient connected to nasal cannula oxygen Cardiovascular status: blood pressure returned to baseline and stable Postop Assessment: no apparent nausea or vomiting Anesthetic complications: no   No notable events documented.   Last Vitals:  Vitals:   05/12/24 1639 05/12/24 1656  BP:  136/88  Pulse: 94 64  Resp: (!) 22   Temp:  37 C  SpO2: 93% 98%    Last Pain:  Vitals:   05/12/24 1656  TempSrc: Temporal  PainSc: 0-No pain                 Lendia LITTIE Mae

## 2024-05-12 NOTE — Discharge Instructions (Signed)
 Discharge instructions following scrotal surgery  Call your doctor for: Fever is greater than 100.5 Severe nausea or vomiting Increasing pain not controlled by pain medication Increasing redness or drainage from incisions  The number for questions or concerns is [phone] [facility] [place])  Activity level: No lifting greater than 10 pounds (about equal to gallon of milk) for the next 2 weeks or until cleared to do so at follow-up appointment.  Otherwise activity as tolerated by comfort level.  Medications: You may resume your regular medications A prescription for hydrocodone  was sent to your pharmacy to take as needed for pain.  You may also take ibuprofen or acetaminophen  if hydrocodone  not needed or desired  Diet: May resume your regular diet as tolerated  Driving: No driving while still taking opiate pain medications (weight at least 6-8 hours after last dose).  No driving if you still sore from surgery as it may limit your ability to react quickly if necessary.   Shower/bath: May shower 48 hours after surgery.  No tub bath, hot tub or swimming for 7 days.  Wound care: You may cover wounds with sterile gauze as needed to prevent incisions rubbing on clothing.  Wear supportive underwear (boxer briefs, compression shorts, jockey shorts, athletic supporter) for at least 2 weeks.  You should apply cold compresses (ice pack of frozen peas/corn) to your scrotum (not directly on the scrotal skin) intermittently for at least 48 hours to reduce the swelling.  You should expect that his scrotum will swell up initially and then get smaller over the next 2-4 weeks.  Bruising is also normal.  Sutures will dissolve and do not need to be removed.  Follow-up appointments: You will be contacted for a postop follow-up appointment

## 2024-05-13 ENCOUNTER — Encounter: Payer: Self-pay | Admitting: Urology

## 2024-05-16 ENCOUNTER — Encounter: Admitting: Physician Assistant

## 2024-05-16 LAB — SURGICAL PATHOLOGY

## 2024-06-14 ENCOUNTER — Ambulatory Visit (INDEPENDENT_AMBULATORY_CARE_PROVIDER_SITE_OTHER): Admitting: Urology

## 2024-06-14 ENCOUNTER — Encounter: Payer: Self-pay | Admitting: Urology

## 2024-06-14 VITALS — BP 145/83 | HR 62 | Ht 76.0 in | Wt 225.0 lb

## 2024-06-14 DIAGNOSIS — Z09 Encounter for follow-up examination after completed treatment for conditions other than malignant neoplasm: Secondary | ICD-10-CM

## 2024-06-17 ENCOUNTER — Encounter: Payer: Self-pay | Admitting: Urology

## 2024-06-17 NOTE — Progress Notes (Signed)
   06/14/2024 3:48 PM   Christopher Savage 07-13-1976 [id number]  Referring provider: Rudolpho Norleen BIRCH, MD [address] [facility] [place],  [place] [address]  Chief Complaint  Patient presents with   Results    HPI: Christopher Savage is a 48 y.o. male present for postop follow-up.  Status post spermatocelectomy 05/12/2024 for a 5.5 cm right spermatocele No postoperative problems.  He has mild upper scrotal tenderness Pathology consistent with benign spermatocele   PMH: Past Medical History:  Diagnosis Date   Alcohol abuse    sober since 2019   Anemia    Depression    GERD (gastroesophageal reflux disease)    Hepatic steatosis    Hyperlipidemia    Hypertension    Leukocytosis    Pancreatitis    Spermatocele     Surgical History: Past Surgical History:  Procedure Laterality Date   COLONOSCOPY     EUS N/A 10/11/2020   Procedure: FULL UPPER ENDOSCOPIC ULTRASOUND (EUS) RADIAL;  Surgeon: Glena Mt, MD;  Location: [facility] ENDOSCOPY;  Service: Endoscopy;  Laterality: N/A;   SPERMATOCELECTOMY Right 05/12/2024   Procedure: EXCISION, SPERMATOCELE;  Surgeon: Twylla Christopher BROCKS, MD;  Location: [facility] ORS;  Service: Urology;  Laterality: Right;   UPPER GI ENDOSCOPY      Home Medications:  Allergies as of 06/14/2024       Reactions   Sulfa Antibiotics Hives        Medication List        Accurate as of June 14, 2024 11:59 PM. If you have any questions, ask your nurse or doctor.          escitalopram  20 MG tablet Commonly known as: LEXAPRO  Take 1.5 tablets (30 mg total) by mouth daily.   HYDROcodone -acetaminophen  5-325 MG tablet Commonly known as: NORCO/VICODIN Take 1 tablet by mouth every 6 (six) hours as needed for moderate pain (pain score 4-6).   multivitamin with minerals Tabs tablet Take 1 tablet by mouth daily.   omeprazole 20 MG capsule Commonly known as: PRILOSEC Take 20 mg by mouth daily.   traZODone  50 MG tablet Commonly known  as: DESYREL  TAKE 1-2 TABLETS (50-100 MG TOTAL) BY MOUTH AT BEDTIME AS NEEDED FOR SLEEP        Allergies:  Allergies  Allergen Reactions   Sulfa Antibiotics Hives    Family History: Family History  Problem Relation Age of Onset   Hypertension Mother    Mental illness Neg Hx     Social History:  reports that he has never smoked. He has never used smokeless tobacco. He reports that he does not currently use alcohol. He reports that he does not currently use drugs.   Physical Exam: BP (!) 145/83   Pulse 62   Ht 6' 4 (1.93 m)   Wt 225 lb (102.1 kg)   BMI 27.39 kg/m   Constitutional:  Alert, No acute distress. HEENT: [place] AT Respiratory: Normal respiratory effort, no increased work of breathing. GU: Incision has healed.  Mild induration superior to the right testis consistent with postoperative change Psychiatric: Normal mood and affect.   Assessment & Plan:   Doing well status post right spermatocelectomy Follow-up prn   Christopher BROCKS Twylla, MD  [facility] [address] [place], [place] [address] [phone]
